# Patient Record
Sex: Male | Born: 1970 | Race: White | Hispanic: Yes | Marital: Married | Smoking: Never smoker
Health system: Southern US, Community
[De-identification: ages and names within clinical notes are randomized; demographics above are authoritative.]

## PROBLEM LIST (undated history)

## (undated) ENCOUNTER — Emergency Department (HOSPITAL_COMMUNITY): Admission: EM | Payer: 59 | Source: Home / Self Care

---

## 1999-04-29 ENCOUNTER — Emergency Department (HOSPITAL_COMMUNITY): Admission: EM | Admit: 1999-04-29 | Discharge: 1999-04-29 | Payer: Self-pay | Admitting: Emergency Medicine

## 1999-06-30 ENCOUNTER — Emergency Department (HOSPITAL_COMMUNITY): Admission: EM | Admit: 1999-06-30 | Discharge: 1999-06-30 | Payer: Self-pay | Admitting: Emergency Medicine

## 1999-10-23 ENCOUNTER — Emergency Department (HOSPITAL_COMMUNITY): Admission: EM | Admit: 1999-10-23 | Discharge: 1999-10-23 | Payer: Self-pay | Admitting: Emergency Medicine

## 1999-10-28 ENCOUNTER — Emergency Department (HOSPITAL_COMMUNITY): Admission: EM | Admit: 1999-10-28 | Discharge: 1999-10-28 | Payer: Self-pay | Admitting: *Deleted

## 2005-07-22 ENCOUNTER — Emergency Department (HOSPITAL_COMMUNITY): Admission: EM | Admit: 2005-07-22 | Discharge: 2005-07-23 | Payer: Self-pay | Admitting: Emergency Medicine

## 2013-07-03 ENCOUNTER — Ambulatory Visit (INDEPENDENT_AMBULATORY_CARE_PROVIDER_SITE_OTHER): Payer: 59 | Admitting: Family Medicine

## 2013-07-03 VITALS — BP 126/72 | HR 50 | Temp 98.0°F | Resp 18 | Ht 65.0 in | Wt 163.0 lb

## 2013-07-03 DIAGNOSIS — R51 Headache: Secondary | ICD-10-CM

## 2013-07-03 DIAGNOSIS — R42 Dizziness and giddiness: Secondary | ICD-10-CM

## 2013-07-03 LAB — POCT CBC
Granulocyte percent: 56.3 % (ref 37–80)
HCT, POC: 45.7 % (ref 43.5–53.7)
Hemoglobin: 15.2 g/dL (ref 14.1–18.1)
Lymph, poc: 2 (ref 0.6–3.4)
MCH, POC: 31 pg (ref 27–31.2)
MCHC: 33.3 g/dL (ref 31.8–35.4)
MCV: 93 fL (ref 80–97)
MID (cbc): 0.4 (ref 0–0.9)
MPV: 9.1 fL (ref 0–99.8)
POC Granulocyte: 3.1 (ref 2–6.9)
POC LYMPH PERCENT: 36.5 %L (ref 10–50)
POC MID %: 7.2 % (ref 0–12)
Platelet Count, POC: 232 10*3/uL (ref 142–424)
RBC: 4.91 M/uL (ref 4.69–6.13)
RDW, POC: 14.3 %
WBC: 5.5 10*3/uL (ref 4.6–10.2)

## 2013-07-03 NOTE — Patient Instructions (Signed)
Dolor de Pensions consultant, preguntas frecuentes y sus respuestas (Headaches, Frequently Asked Questions) CEFALEAS MIGRAOSAS P: Qu es la migraa? Qu la ocasiona? Cmo puedo tratarla? R: En general, la migraa comienza como un dolor apagado. Luego progresa hacia un dolor, constante, punzante y como un latido. Sentir Copy las sienes. Podr sentir Aeronautical engineer parte anterior o posterior de la cabeza, o en uno o ambos lados. El dolor suele estar acompaado de una combinacin de:  Nuseas.  Vmitos.  Sensibilidad a la luz y los ruidos. Algunas personas (un 15%) experimentan un aura (ver abajo) antes de un ataque. La causa de la migraa se debe a reacciones qumicas del cerebro. El tratamiento para la migraa puede incluir medicamentos de [place]. Tambin puede incluir tcnicas de [place]. Estas incluyen entrenamientos para la relajacin y biorretroalimentacin.  P: Qu es un aura? R: Alrededor del 15% de las personas con migraa tiene un "aura". Es una seal de sntomas neurolgicos que ocurren antes de un dolor de cabeza por migraas. Podr ver lneas onduladas o irregulares, puntos o luces parpadeantes. Podr experimentar visin de tnel o puntos ciegos en uno o ambos ojos. El aura puede incluir alucinaciones visuales o auditivas (algo que se imagina). Puede incluir trastornos en el olfato (como olores extraos), el tacto o el gusto. Entre otros sntomas se incluyen:  Adormecimiento.  Sensacin de hormigueo.  Dificultad para recordar o Tax adviser. Estos episodios neurolgicos pueden durar hasta 60 minutos. Los sntomas desaparecern a medida que el dolor de cabeza comience. P:Qu es un disparador? R: Ciertos factores fsicos o Best boy a "disparar" una migraa. Estos son:  Alimentos.  Cambios hormonales.  Clima.  Estrs. Es importante recordar que los disparadores son diferentes entre si. Para ayudar a prevenir ataques de migraas, necesitar  descubrir cules son los Engineer, civil (consulting). Lleve un diario sobre sus dolores de [place]. Este es un buen modo para descubrir los disparadores. El Visual merchandiser en el momento de hablar con el profesional acerca de su enfermedad. P: El clima afecta en las migraas? R: La luz solar, el calor, la humedad y lo cambios drsticos en la presin Doctor, hospital a, o "disparar" un ataque de migraa en [employer]. Pero estudios han demostrado que el clima no acta como disparador para todas las personas con [place]. P: Cul es la relacin entre la migraa y la hormonas? R: Las hormonas inician y [place] funciones corporales. Las hormonas [employer] balance en el cuerpo dentro de los constantes cambios de [place]. Algunas veces, el nivel de hormonas en el cuerpo se desbalancea. Por ejemplo, durante la menstruacin, el embarazo o la [place]. Pueden ser la causa de un ataque de migraa. De hecho, alrededor de tres cuartos de las mujeres con migraa informan que sus ataques estn relacionados con el ciclo menstrual.  P: Aumenta el riesgo de sufrir un choque cardaco en las personas que padecen migraa? R: La probabilidad de que un ataque de migraa ocasione un ataque cardaco es muy remota. Esto no quiere Google persona que sufre de migraa no pueda tener un ataque cardaco asociado con ella. En las personas menores de 40 aos, el factor ms comn para un ataque es la [place]. Pero durante la vida de una persona, la ocurrencia de un dolor de cabeza por migraa est asociada con una reduccin en el riesgo de morir por un ataque cerebrovascular.  P: Cules son los medicamentos para la migraa? R: La  por migraña está asociada con una reducción en el riesgo de morir por un ataque cerebrovascular.   P: ¿Cuáles son los medicamentos para la migraña?  R: La medicación precisa se utiliza para tratar el dolor de cabeza una vez que ha comenzado. Son ejemplos, medicamentos de venta libre, desinflamatorios sin esteroides, ergotamínicos y triptanos.   P: ¿Qué son los triptanos?  R: Lo triptanos son una nueva clase de  medicamentos abortivos. Son específicos para tratar este problema. Los triptanos son vasoconstrictores. Moderan algunas reacciones químicas del cerebro. Los triptanos trabajan como receptores del cerebro. Ayudan a restaurar el balance de un neurotransmisor denominado serotonina. Se cree que las fluctuaciones en los niveles de serotonina son la causa principal de la migraña.   P: ¿Son efectivos los medicamentos de venta libre para la migraña?  R: Los medicamentos de venta libre pueden ser efectivos para aliviar dolores leves a moderados y los síntomas asociados a la migraña. Pero deberá consultar a un médico antes de comenzar cualquier tratamiento para la migraña.   P: ¿Cuáles son los medicamentos de prevención de la migraña?  R: Se suele denominar tratamiento "profiláctico" a los medicamentos para la prevención de la migraña. Se utilizan para reducir la frecuencia, gravedad y duración de los ataques de migraña. Son ejemplos de medicamentos de prevención: antiepilépticos, antidepresivos, bloqueadores beta, bloqueadores de los canales de calcio y medicamentos antiinflamatorios sin esteroides.  P: ¿ Por qué se utilizan anticonvulsivantes para tratar la migraña?  R: Durante los últimos años, ha habido un creciente interés en las drogas antiepilépticas para la prevención de la migraña. A menudo se los conoce como "anticonvulsivantes". La epilepsia y la migraña suceden por reacciones similares en el cerebro.   P: ¿ Por qué se utilizan antidepresivos para tratar la migraña?  R: Los antidepresivos típicamente se utilizan para tratar a las personas con depresión. Pueden reducir la frecuencia de la migraña a través de la regulación de los niveles químicos, como la serotonina, en el cerebro.   P: ¿ Por qué se utilizan terapias alternativas para tratar la migraña?  R: El término "terapias alternativas" suelen utilizarse para describir los tratamientos que se considera que están por fuera de alcance la medicina occidental  convencional. Son ejemplos de las terapias alternativas: la acupuntura, la acupresión y el yoga. Otra terapia alternativa común es la terapia herbal. Se cree que algunas hierbas ayudan a aliviar los dolores de cabeza. Siempre consulte con el profesional acerca de las terapias alternativas antes de utilizarlas. Algunos productos herbales contienen arsénico y otras toxinas.  DOLORES DE CABEZA POR TENSIÓN  P: ¿Qué es un dolor de cabeza por tensión? ¿Qué lo ocasiona? ¿Cómo puedo tratarlo?  R: Los dolores de cabeza por tensión ocurren al azar. A menudo son el resultado de estrés temporario, ansiedad, fatiga o ira. Los síntomas incluyen dolor en las sienes, una sensación como de tener una banda alrededor de la cabeza (un dolor que "presiona"). Los síntomas pueden incluir una sensación de empuje, de presión y contracción de los músculos de la cabeza y el cuello. El dolor comienza en la frente, sienes o en la parte posterior de la cabeza y el cuello. El tratamiento para los dolores de cabeza por tensión puede incluir medicamentos de venta libre. También puede incluir técnicas de autoayuda con entrenamientos para la relajación y biorretroalimentación.  CEFALEA EN RACIMOS  P: ¿Qué es una cefalea en racimos? ¿Qué la ocasiona? ¿Cómo puedo tratarla?  R: La cefalea en racimos toma su nombre debido a que los ataques vienen   en grupos. El dolor aparece con poco o ningún aviso. Normalmente ocurre de un lado de la cabeza. Muchas veces el dolor viene acompañado de un lagrimeo u ojo rojo y goteo de la nariz del mismo lado que el dolor. Se cree que la causa es una reacción en las sustancias químicas del cerebro. Se describe como el caso más grave e intenso de cualquier tipo de dolor de cabeza. El tratamiento incluye medicamentos bajo receta y oxígeno.  CEFALEA SINUSAL  P: ¿Qué es una cefalea sinusal? ¿Qué la ocasiona? ¿Cómo puedo tratarla?  R: Cuando se inflama una cavidad en los huesos de la cara y el cráneo (sinus) ocasiona un dolor  localizado. Esta enfermedad generalmente es el resultado de una reacción alérgica, un tumor o una infección. Si el dolor de cabeza está ocasionado por un bloqueo del sinus, como una infección, probablemente tendrá fiebre. Una imagen de rayos X confirmará el bloqueo del sinus. El tratamiento indicado por el médico podrá incluir antibióticos para la infección, y también antihistamínicos o descongestivos.   DOLOR DE CABEZA POR EFECTO "REBOTE"  P: ¿Qué es un dolor de cabeza por efecto "rebote"? ¿Qué lo ocasiona? ¿Cómo puedo tratarlo?  R: Si se toman medicamentos para el dolor de cabeza muy a menudo puede llevar a la enfermedad conocida como "dolor de cabeza por rebote". Un patrón de abuso de medicamentos para el dolor de cabeza supone tomarlos más de dos veces por semana o en cantidades excesivas. Esto significa más que lo que indica el envase o el médico. Con los dolores de cabeza por rebote, los medicamentos no sólo dejan de aliviar el dolor sino que además comienzan a ocasionar dolores de cabeza. Los médicos tratan los dolores de cabeza por rebote mediante la disminución del medicamento del que se ha abusado. A veces el medico podrá sustituir gradualmente por un tipo diferente de tratamiento o medicación. Dejar de consumirlo podría ser difícil. El abuso regular de un medicamento aumenta el potencial que se produzcan efectos secundarios graves. Consulte con un médico si utiliza regularmente medicamentos para el dolor de cabeza más de dos días por semana o más de lo que indica el envase.  PREGUNTAS Y RESPUESTAS ADICIONALES  P: ¿Qué es la biorretroalimentación?  R: La biorretroalimentación es un tratamiento de autoayuda. La biorretroalimentación utiliza un equipamiento especial para controlar los movimientos involuntarios del cuerpo y las respuestas físicas. La biorretroalimentación controla:  · Respiración.  · Pulso.  · Latidos cardíacos.  · Temperatura.  · Tensión muscular.  · Actividad cerebrales.  La  biorretroalimentación le ayudará a mejorar y perfeccionar sus ejercicios de relajación. Aprenderá a controlar las respuestas físicas relacionadas con el estrés. Una vez que se dominan las técnicas no necesitará más el equipamiento.  P: ¿Son hereditarios los dolores de cabeza?  R: Según algunas estimaciones, aproximadamente 28 millones de estadounidenses sufren migraña. Cuatro de cada cinco (80%) informan una historia familiar de migraña. Los investigadores no pueden asegurar si se trata de un problema genético o una predisposición familiar. A pesar de esto, un niño tiene 50% de probabilidades de sufrir migraña si uno de sus padres la sufre. El niño tiene un 75% de probabilidades si ambos padres la sufren.   P. ¿Puede un niño tener migraña?  R: En el momento de ingresar a la escuela secundaria, la mayoría de los jóvenes han experimentado algún tipo de cefalea. Algunos abordajes o medicamentos seguros y efectivos pueden evitar las cefaleas o detenerlas luego de que han comenzado.   P. ¿Qué tipo de   para las [address]) proporcionar, a pedido, Agricultural engineer de los mdicos que son miembros de [place]. Document Released: 10/29/2008 Document Revised: 02/08/2012 [facility] Patient Information 2014 [place], [place]. Mareos  (Dizziness)  Los mareos son un problema muy frecuente. Es Neomia Dear sensacin de inestabilidad y aturdimiento. Puede sentir que se va a desvanecer. Puede provocarle un traumatismo si se tropieza o se cae. Las Dealer de cualquier edad pueden sufrir  mareos, pero es ms frecuente [employer] ancianos.  CAUSAS  La causa puede deberse a muchos problemas diferentes:   Problemas en el odo medio.  Estar de pie [employer].  Infecciones.  Reacciones alrgicas.  El envejecimiento.  Respuesta emocional a distintas cosas, como por ejemplo la visin de sangre.  Efectos secundarios de Nature conservation officer.  Fatiga.  Problemas circulatorios o de presin arterial.  Consumo excesivo de alcohol, medicamentos o drogas.  Respirar muy rpidamente (hiperventilacin).  Arritmias o problemas con el ritmo cardaco.  Anemia o bajo recuento de glbulos rojos.  Embarazo.  Vmitos, diarrea, fiebre u otras enfermedades que causan deshidratacin.  Enfermedades como presin alta (hipertensin arterial), diabetes, problemas tiroideos y enfermedad de Occupational hygienist.  Exposicin al calor extremo. DIAGNSTICO  Education officer, museum la causa de los [place], el mdico har un examen fsico, indicar anlisis de laboratorio, diagnsticos por imgenes o un electrocardiograma (ECG).  TRATAMIENTO  El tratamiento de los mareos depende de la causa de los sntomas y Advertising account planner.  INSTRUCCIONES PARA EL CUIDADO DEL ENFERMO EN LA CASA  Beba gran cantidad de lquido para mantener la orina de tono claro o color amarillo plido. Esto es especialmente importante en climas muy clidos. A medida que envejece, tambin es importante en climas fros.  Si usted es vertiginoso por las medicaciones, tmelas como se le dirigi. Con las medicaciones de presin arteriales es especialmente importante levantarse despacio.  Prese lentamente de las sillas y [employer] que se sienta bien.  Al levantarse por la maana, sintese primero en un lado de la cama. Cuando se sienta bien, prese lentamente tomndose de algn objeto firme hasta que sentirse en equilibrio.  Si usted encuentra que todava se pone vertiginoso cuando est de pie en un lugar por mucho tiempo, mueva  frecuentemente sus piernas, apriete y relaje los msculos en sus piernas mientras estando de pie.  Si el vrtigo todava contina siendo un problema o le preocupa, tenga alguien con usted por un da o dos hasta que usted mejore y pueda quedarse slo. Haga que esa persona llame a su dador del cuidado si notan cambios en usted que [employer].  No conduzca vehculos ni utilice maquinarias pesadas si se siente mareado.  No beba alcohol. SOLICITE ATENCIN MDICA INMEDIATO SI:  El vrtigo o mareos se estn poniendo peores.  Usted desarrolla nusea o vomitando.  Usted desarrolla problemas hablando o caminando, problemas con debilidad, o problemas usando sus brazos, manos o piernas.  Usted piensa que no est pensando tan claramente como normalmente lo hace, o usted tiene dificultad formando frases. Podr consultar a un amigo o miembro de la familia para que determine si su pensamiento es normal.  Midwife, dolor abdominal, le falta el aire o transpira.  Usted tiene cambios en su visin.  Observa un sangrado.  Usted desarrolla complicaciones con la medicacin que parecen estar ponindose peores en lugar de mejores. EST SEGURO QUE:   Comprende las instrucciones para el alta mdica.  Controlar su enfermedad.  Solicitar atencin mdica de inmediato segn las indicaciones. Document Released:  11/16/2005 Document Revised: 02/08/2012 [employer] Patient Information 2014 [place], [place].

## 2013-07-03 NOTE — Progress Notes (Signed)
[facility]:  Office Visit  Chief Complaint:  Chief Complaint  Patient presents with  . Headache    pain lt side of head x 2 weeks getting dizzy    HPI: Scott English is a 42 y.o. male who complains of left sided headpain and radiates to the back x 2 weeks and he has associated dizziness. Dizziness can occur at anytime, not related to position or head movement. He denies having nausea, vomiting, CP, SOB or abd pain. He has not been confused.  He denies having any vision problems/photophobia/rashes or neck pain. He does get dizzy, x 2 today, dizziness,\ lasts for  2 seconds. He denies more dizziness when he moves his head, he has dizziness when he is walking. His gait is the same. Denies fevers or chills. Has been running without problems. He has not taken anything for this. He drinks a lot of water. No prior history of HA. Denies weakness, urinary sxs,  diaphoresis with this.   History reviewed. No pertinent past medical history. History reviewed. No pertinent past surgical history. History   Social History  . Marital Status: Married    Spouse Name: N/A    Number of Children: N/A  . Years of Education: N/A   Social History Main Topics  . Smoking status: Never Smoker   . Smokeless tobacco: Never Used  . Alcohol Use: No  . Drug Use: No  . Sexually Active: None   Other Topics Concern  . None   Social History Narrative  . None   History reviewed. No pertinent family history. Not on File Prior to Admission medications   Not on File     ROS: The patient denies fevers, chills, night sweats, unintentional weight loss, chest pain, palpitations, wheezing, dyspnea on exertion, nausea, vomiting, abdominal pain, dysuria, hematuria, melena, numbness, weakness, or tingling.   All other systems have been reviewed and were otherwise negative with the exception of those mentioned in the HPI and as above.    PHYSICAL EXAM: Filed Vitals:   07/03/13 2111  BP:  126/72  Pulse: 50  Temp:   Resp:    Filed Vitals:   07/03/13 1944  Height: 5\' 5"  (1.651 m)  Weight: 163 lb (73.936 kg)   Body mass index is 27.12 kg/(m^2).  General: Alert, no acute distress HEENT:  Normocephalic, atraumatic, oropharynx patent. EOMI, PERRLA, fundoscopic exam nl. Tm nl Cardiovascular:  Regular rate and rhythm, no rubs murmurs or gallops.  No Carotid bruits, radial pulse intact. No pedal edema.  Respiratory: Clear to auscultation bilaterally.  No wheezes, rales, or rhonchi.  No cyanosis, no use of accessory musculature GI: No organomegaly, abdomen is soft and non-tender, positive bowel sounds.  No masses. Skin: No rashes. Neurologic: Facial musculature symmetric. CN 2-12 grossly nl Psychiatric: Patient is appropriate throughout our interaction. Lymphatic: No cervical lymphadenopathy Musculoskeletal: Gait intact.   LABS: Results for orders placed in visit on 07/03/13  POCT CBC      Result Value Range   WBC 5.5  4.6 - 10.2 K/uL   Lymph, poc 2.0  0.6 - 3.4   POC LYMPH PERCENT 36.5  10 - 50 %L   MID (cbc) 0.4  0 - 0.9   POC MID % 7.2  0 - 12 %M   POC Granulocyte 3.1  2 - 6.9   Granulocyte percent 56.3  37 - 80 %G   RBC 4.91  4.69 - 6.13 M/uL   Hemoglobin 15.2  14.1 - 18.1  g/dL   HCT, POC 16.1  09.6 - 53.7 %   MCV 93.0  80 - 97 fL   MCH, POC 31.0  27 - 31.2 pg   MCHC 33.3  31.8 - 35.4 g/dL   RDW, POC 04.5     Platelet Count, POC 232  142 - 424 K/uL   MPV 9.1  0 - 99.8 fL     EKG/XRAY:   Primary read interpreted by Dr. Conley Rolls at [facility]   ASSESSMENT/PLAN: Encounter Diagnoses  Name Primary?  . Headache(784.0) Yes  . Dizziness    Orthostatics nl CMP, ESR  pending Try excedrin otc and see if HA goes away then perhaps dizziness goes away F/u prn    Marda Breidenbach PHUONG, DO 07/03/2013 9:35 PM

## 2013-07-04 LAB — COMPREHENSIVE METABOLIC PANEL WITH GFR
ALT: 33 U/L (ref 0–53)
AST: 24 U/L (ref 0–37)
CO2: 28 meq/L (ref 19–32)
Calcium: 9.3 mg/dL (ref 8.4–10.5)
Chloride: 104 meq/L (ref 96–112)
Potassium: 3.8 meq/L (ref 3.5–5.3)
Sodium: 138 meq/L (ref 135–145)
Total Protein: 8.1 g/dL (ref 6.0–8.3)

## 2013-07-04 LAB — COMPREHENSIVE METABOLIC PANEL
Albumin: 4.5 g/dL (ref 3.5–5.2)
Alkaline Phosphatase: 52 U/L (ref 39–117)
BUN: 15 mg/dL (ref 6–23)
Creat: 1.07 mg/dL (ref 0.50–1.35)
Glucose, Bld: 101 mg/dL — ABNORMAL HIGH (ref 70–99)
Total Bilirubin: 0.5 mg/dL (ref 0.3–1.2)

## 2013-07-04 LAB — SEDIMENTATION RATE: Sed Rate: 1 mm/h (ref 0–16)

## 2013-11-27 ENCOUNTER — Emergency Department (HOSPITAL_COMMUNITY): Payer: 59

## 2013-11-27 ENCOUNTER — Emergency Department (HOSPITAL_COMMUNITY)
Admission: EM | Admit: 2013-11-27 | Discharge: 2013-11-27 | Disposition: A | Discharge status: Home / Self Care | Payer: 59 | Attending: Emergency Medicine | Admitting: Emergency Medicine

## 2013-11-27 ENCOUNTER — Encounter (HOSPITAL_COMMUNITY): Payer: Self-pay | Admitting: Emergency Medicine

## 2013-11-27 DIAGNOSIS — R1013 Epigastric pain: Secondary | ICD-10-CM

## 2013-11-27 DIAGNOSIS — R0789 Other chest pain: Secondary | ICD-10-CM | POA: Insufficient documentation

## 2013-11-27 DIAGNOSIS — R1012 Left upper quadrant pain: Secondary | ICD-10-CM | POA: Insufficient documentation

## 2013-11-27 LAB — CBC WITH DIFFERENTIAL/PLATELET
Eosinophils Relative: 6 % — ABNORMAL HIGH (ref 0–5)
HCT: 43.2 % (ref 39.0–52.0)
Hemoglobin: 15.4 g/dL (ref 13.0–17.0)
Lymphocytes Relative: 33 % (ref 12–46)
Lymphs Abs: 1.5 10*3/uL (ref 0.7–4.0)
MCV: 87.1 fL (ref 78.0–100.0)
Platelets: 191 10*3/uL (ref 150–400)
RBC: 4.96 MIL/uL (ref 4.22–5.81)
WBC: 4.7 10*3/uL (ref 4.0–10.5)

## 2013-11-27 LAB — URINALYSIS, ROUTINE W REFLEX MICROSCOPIC
Glucose, UA: NEGATIVE mg/dL
Hgb urine dipstick: NEGATIVE
Nitrite: NEGATIVE
Protein, ur: NEGATIVE mg/dL
Specific Gravity, Urine: 1.013 (ref 1.005–1.030)
Urobilinogen, UA: 0.2 mg/dL (ref 0.0–1.0)

## 2013-11-27 LAB — LIPASE, BLOOD: Lipase: 52 U/L (ref 11–59)

## 2013-11-27 LAB — COMPREHENSIVE METABOLIC PANEL
BUN: 17 mg/dL (ref 6–23)
CO2: 30 mEq/L (ref 19–32)
Calcium: 9 mg/dL (ref 8.4–10.5)
Creatinine, Ser: 0.83 mg/dL (ref 0.50–1.35)
GFR calc Af Amer: >90 mL/min (ref >90)
GFR calc non Af Amer: >90 mL/min (ref >90)
Glucose, Bld: 99 mg/dL (ref 70–99)
Sodium: 139 mEq/L (ref 135–145)
Total Protein: 8.2 g/dL (ref 6.0–8.3)

## 2013-11-27 MED ORDER — GI COCKTAIL ~~LOC~~
30.0000 mL | Freq: Once | ORAL | Status: AC
  Administered 2013-11-27: 30 mL via ORAL
  Filled 2013-11-27: qty 30

## 2013-11-27 MED ORDER — OMEPRAZOLE 20 MG PO CPDR: DELAYED_RELEASE_CAPSULE | ORAL | Status: DC

## 2013-11-27 MED ORDER — SUCRALFATE 1 G PO TABS: 1.0000 g | ORAL_TABLET | Freq: Three times a day (TID) | ORAL | Status: DC

## 2013-11-27 NOTE — ED Notes (Signed)
Translator phones used to speak to patient. And girlfriend assisting with translation. Pt sts was seen at [facility] 3 weeks ago and given rx for abx and

## 2013-11-27 NOTE — ED Notes (Signed)
Translator stated, he's had a stomach ache for 3 weeks and has went to [facility] and no better

## 2013-11-27 NOTE — ED Provider Notes (Signed)
CSN: [id number]     Arrival date & time 11/27/13  1025 History   First MD Initiated Contact with Patient 11/27/13 1101     Chief Complaint  Patient presents with  . Abdominal Pain   (Consider location/radiation/quality/duration/timing/severity/associated sxs/prior Treatment) HPI Comments: Patient with no significant past medical history, no previous abdominal surgeries -- presents with complaint of left upper quadrant abdominal pain and lower chest pain for the past 6 weeks. Patient states that the pain has been worse over the past 2 days. It hurts worse when he pushes on the area over his lower chest and upper abdomen. It is not changed with food. It is not associated with vomiting but he has had some nausea. No fever, change in stools, change in urine. He denies heavy alcohol or NSAID use. He denies chest pain or shortness of breath. The onset of this condition was insidious. The course is constant. Aggravating factors: palpation. Alleviating factors: none.    Patient was seen at an outside urgent care and had lab tests done. Patient and friend forgot to bring the medications he is on, results from the outside urgent care. They do not remember any of these results. It sounds like he was placed on 3 medications, triple therapy? And told that he had a bacteria, H. Pylori? These medications did not improve the patient's symptoms and is here for a second opinion today. He does not have a primary care physician or other primary care followup.  Patient is a 42 y.o. male presenting with abdominal pain. The history is provided by the patient and a friend. A language interpreter was used.  Abdominal Pain Associated symptoms: chest pain and nausea   Associated symptoms: no cough, no diarrhea, no dysuria, no fever, no shortness of breath, no sore throat and no vomiting     History reviewed. No pertinent past medical history. History reviewed. No pertinent past surgical history. No family history on  file. History  Substance Use Topics  . Smoking status: Never Smoker   . Smokeless tobacco: Never Used  . Alcohol Use: No    Review of Systems  Constitutional: Negative for fever.  HENT: Negative for rhinorrhea and sore throat.   Eyes: Negative for redness.  Respiratory: Negative for cough and shortness of breath.   Cardiovascular: Positive for chest pain. Negative for leg swelling.  Gastrointestinal: Positive for nausea and abdominal pain. Negative for vomiting and diarrhea.  Genitourinary: Negative for dysuria.  Musculoskeletal: Negative for myalgias.  Skin: Negative for rash.  Neurological: Negative for headaches.    Allergies  Review of patient's allergies indicates not on file.  Home Medications  No current outpatient prescriptions on file. BP 125/65  Pulse 52  Temp(Src) 98.4 F (36.9 C) (Oral)  Resp 15  SpO2 99% Physical Exam  Nursing note and vitals reviewed. Constitutional: He appears well-developed and well-nourished.  HENT:  Head: Normocephalic and atraumatic.  Eyes: Conjunctivae are normal. Right eye exhibits no discharge. Left eye exhibits no discharge.  Neck: Normal range of motion. Neck supple.  Cardiovascular: Normal rate, regular rhythm and normal heart sounds.   Pulmonary/Chest: Effort normal and breath sounds normal. He exhibits tenderness.    Abdominal: Soft. Bowel sounds are normal. He exhibits no distension. There is tenderness (mild) in the left upper quadrant. There is no rigidity, no rebound, no guarding, no CVA tenderness, no tenderness at McBurney's point and negative Murphy's sign.  Neurological: He is alert.  Skin: Skin is warm and dry.  Psychiatric: He has  a normal mood and affect.    ED Course  Procedures (including critical care time) Labs Review Labs Reviewed - No data to display Imaging Review No results found.  EKG Interpretation   None      11:22 AM Patient seen and examined. Work-up initiated. Patient does not want  medication at this time.    Vital signs reviewed and are as follows: Filed Vitals:   11/27/13 1041  BP: 125/65  Pulse: 52  Temp: 98.4 F (36.9 C)  Resp: 15   1:01 PM patient and family informed of all results. Will discharge to home with PPI and Carafate. He is also given a GI referral and encouraged to followup. PCP referrals also given.  Patient appears well and is eating and drinking in room. Exam is unchanged.  The patient was urged to return to the Emergency Department immediately with worsening of current symptoms, worsening abdominal pain, persistent vomiting, blood noted in stools, fever, or any other concerns. The patient verbalized understanding.      MDM   1. Epigastric pain    Patient with epigastric pain.  Vitals are stable, no fever.  No signs of dehydration, tolerating PO's.  Lungs are clear.  No focal abdominal pain, no concern for appendicitis, cholecystitis, pancreatitis, ruptured viscus, UTI, kidney stone, or any other abdominal etiology.  Suspect gastritis versus PUD. Will refer to GI for definitive diagnosis. Will continue symptomatic treatment. Patient appears well, nontoxic. No surgical emergency suspected. Supportive therapy indicated with return if symptoms worsen.  Patient counseled.     Renne Crigler, PA-C 11/27/13 1302

## 2013-11-29 NOTE — ED Provider Notes (Signed)
Medical screening examination/treatment/procedure(s) were performed by non-physician practitioner and as supervising physician I was immediately available for consultation/collaboration.  EKG Interpretation   None         Rohin Krejci, MD 11/29/13 0657 

## 2014-04-09 ENCOUNTER — Ambulatory Visit (INDEPENDENT_AMBULATORY_CARE_PROVIDER_SITE_OTHER): Payer: 59 | Admitting: Family Medicine

## 2014-04-09 VITALS — BP 124/70 | HR 68 | Temp 97.6°F | Resp 16 | Ht 65.0 in | Wt 163.0 lb

## 2014-04-09 DIAGNOSIS — L255 Unspecified contact dermatitis due to plants, except food: Secondary | ICD-10-CM

## 2014-04-09 DIAGNOSIS — L237 Allergic contact dermatitis due to plants, except food: Secondary | ICD-10-CM

## 2014-04-09 MED ORDER — TRIAMCINOLONE ACETONIDE 0.1 % EX CREA: 1.0000 "application " | TOPICAL_CREAM | Freq: Three times a day (TID) | CUTANEOUS | Status: DC

## 2014-04-09 MED ORDER — HYDROXYZINE HCL 25 MG PO TABS: 12.5000 mg | ORAL_TABLET | Freq: Three times a day (TID) | ORAL | Status: DC | PRN

## 2014-04-09 MED ORDER — METHYLPREDNISOLONE ACETATE 80 MG/ML IJ SUSP
120.0000 mg | Freq: Once | INTRAMUSCULAR | Status: AC
  Administered 2014-04-09: 120 mg via INTRAMUSCULAR

## 2014-04-09 NOTE — Progress Notes (Signed)
Subjective:    Patient ID: Scott English, male    DOB: 02/11/1971, 43 y.o.   MRN: [id number]  Chief Complaint  Patient presents with  . Rash    poison ivy x 3 days    HPI This chart was scribed for Norberto SorensonEva Tomekia Helton, MD by Evon Slackerrance Branch, ED Scribe. This Patient was seen in room 02 and the patients care was started at 8:35 PM  HPI Comments: Scott English is a 43 y.o. male who presents to the [facility] with his girlfriend complaining of poison ivy spread over both arms, around neck, and around his legs onset 3 days prior to arrival. He was working out in the yard and new he was in poison ivy but just kept on going - ripping the weeds out.  He states that he has been trying bleach with no relief of symptoms.   No past medical history on file. Current Outpatient Prescriptions on File Prior to Visit  Medication Sig Dispense Refill  . omeprazole (PRILOSEC) 20 MG capsule Take one capsule PO twice a day for 3 days, then one capsule PO once a day  20 capsule  0  . sucralfate (CARAFATE) 1 G tablet Take 1 tablet (1 g total) by mouth 4 (four) times daily -  with meals and at bedtime.  60 tablet  0   No current facility-administered medications on file prior to visit.   No Known Allergies   Review of Systems  Constitutional: Negative for fever, chills, activity change and appetite change.  Cardiovascular: Negative for leg swelling.  Gastrointestinal: Negative for nausea, vomiting, abdominal pain, diarrhea and constipation.  Musculoskeletal: Negative for gait problem and joint swelling.  Skin: Positive for rash. Negative for wound.  Neurological: Negative for weakness and numbness.  Hematological: Negative for adenopathy. Does not bruise/bleed easily.    Objective:  BP 124/70  Pulse 68  Temp(Src) 97.6 F (36.4 C) (Oral)  Resp 16  Ht 5\' 5"  (1.651 m)  Wt 163 lb (73.936 kg)  BMI 27.12 kg/m2  SpO2 98%  Physical Exam  Nursing note and vitals reviewed. Constitutional:  He is oriented to person, place, and time. He appears well-developed and well-nourished. No distress.  HENT:  Head: Normocephalic and atraumatic.  Eyes: EOM are normal.  Neck: Neck supple.  Cardiovascular: Normal rate.   Pulmonary/Chest: Effort normal. No respiratory distress.  Musculoskeletal: Normal range of motion.  Neurological: He is alert and oriented to person, place, and time.  Skin: Skin is warm and dry.  Multiple pinpoint vesicles on erythematous space spread diffusely over all 4 extremities.   Psychiatric: He has a normal mood and affect. His behavior is normal.    Assessment & Plan:  Poison ivy - Plan: methylPREDNISolone acetate (DEPO-MEDROL) injection 120 mg Will proceed w/ systemic trx due to diffuse areas affected. Pt prefers injection. Warned of prolonged course w/ rus dermatitis so can start stop trxing pruritic or new areas w/ top TAC in 2-3d. Warned of poss of secondary bacterial infxn - RTC if persists or worsens. Meds ordered this encounter  Medications  . methylPREDNISolone acetate (DEPO-MEDROL) injection 120 mg    Sig:   . hydrOXYzine (ATARAX/VISTARIL) 25 MG tablet    Sig: Take 0.5-1 tablets (12.5-25 mg total) by mouth every 8 (eight) hours as needed for itching.    Dispense:  30 tablet    Refill:  0  . triamcinolone cream (KENALOG) 0.1 %    Sig: Apply 1 application topically 3 (three)  times daily.    Dispense:  85.2 g    Refill:  0    I personally performed the services described in this documentation, which was scribed in my presence. The recorded information has been reviewed and considered, and addended by me as needed.  Norberto SorensonEva Haislee Corso, MD MPH

## 2014-04-09 NOTE — Patient Instructions (Signed)
Poison Ivy Poison ivy is a inflammation of the skin (contact dermatitis) caused by touching the allergens on the leaves of the ivy plant following previous exposure to the plant. The rash usually appears 48 hours after exposure. The rash is usually bumps (papules) or blisters (vesicles) in a linear pattern. Depending on your own sensitivity, the rash may simply cause redness and itching, or it may also progress to blisters which may break open. These must be well cared for to prevent secondary bacterial (germ) infection, followed by scarring. Keep any open areas dry, clean, dressed, and covered with an antibacterial ointment if needed. The eyes may also get puffy. The puffiness is worst in the morning and gets better as the day progresses. This dermatitis usually heals without scarring, within 2 to 3 weeks without treatment. HOME CARE INSTRUCTIONS  Thoroughly wash with soap and water as soon as you have been exposed to poison ivy. You have about one half hour to remove the plant resin before it will cause the rash. This washing will destroy the oil or antigen on the skin that is causing, or will cause, the rash. Be sure to wash under your fingernails as any plant resin there will continue to spread the rash. Do not rub skin vigorously when washing affected area. Poison ivy cannot spread if no oil from the plant remains on your body. A rash that has progressed to weeping sores will not spread the rash unless you have not washed thoroughly. It is also important to wash any clothes you have been wearing as these may carry active allergens. The rash will return if you wear the unwashed clothing, even several days later. Avoidance of the plant in the future is the best measure. Poison ivy plant can be recognized by the number of leaves. Generally, poison ivy has three leaves with flowering branches on a single stem. Diphenhydramine may be purchased over the counter and used as needed for itching. Do not drive with  this medication if it makes you drowsy.Ask your caregiver about medication for children. SEEK MEDICAL CARE IF:  Open sores develop.  Redness spreads beyond area of rash.  You notice purulent (pus-like) discharge.  You have increased pain.  Other signs of infection develop (such as fever). Document Released: 11/13/2000 Document Revised: 02/08/2012 Document Reviewed: 10/02/2009 ExitCare Patient Information 2014 [employer].  

## 2014-04-13 ENCOUNTER — Ambulatory Visit (INDEPENDENT_AMBULATORY_CARE_PROVIDER_SITE_OTHER): Payer: 59 | Admitting: Physician Assistant

## 2014-04-13 VITALS — BP 110/60 | HR 70 | Temp 98.2°F | Resp 16 | Ht 65.0 in | Wt 157.0 lb

## 2014-04-13 DIAGNOSIS — L255 Unspecified contact dermatitis due to plants, except food: Secondary | ICD-10-CM

## 2014-04-13 DIAGNOSIS — L282 Other prurigo: Secondary | ICD-10-CM

## 2014-04-13 DIAGNOSIS — L237 Allergic contact dermatitis due to plants, except food: Secondary | ICD-10-CM

## 2014-04-13 MED ORDER — METHYLPREDNISOLONE SODIUM SUCC 125 MG IJ SOLR
125.0000 mg | Freq: Once | INTRAMUSCULAR | Status: AC
  Administered 2014-04-13: 125 mg via INTRAMUSCULAR

## 2014-04-13 MED ORDER — PREDNISONE 20 MG PO TABS: ORAL_TABLET | ORAL | Status: DC

## 2014-04-13 NOTE — Patient Instructions (Signed)
Take Zyrtec daily in the morning and Atarax at bedtime Start prednisone taper tomorrow (take doses in the a.m.) Continue TAC cream 2-3 times daily         [facility] Poison ivy is a inflammation of the skin (contact dermatitis) caused by touching the allergens on the leaves of the ivy plant following previous exposure to the plant. The rash usually appears 48 hours after exposure. The rash is usually bumps (papules) or blisters (vesicles) in a linear pattern. Depending on your own sensitivity, the rash may simply cause redness and itching, or it may also progress to blisters which may break open. These must be well cared for to prevent secondary bacterial (germ) infection, followed by scarring. Keep any open areas dry, clean, dressed, and covered with an antibacterial ointment if needed. The eyes may also get puffy. The puffiness is worst in the morning and gets better as the day progresses. This dermatitis usually heals without scarring, within 2 to 3 weeks without treatment. HOME CARE INSTRUCTIONS  Thoroughly wash with soap and water as soon as you have been exposed to poison ivy. You have about one half hour to remove the plant resin before it will cause the rash. This washing will destroy the oil or antigen on the skin that is causing, or will cause, the rash. Be sure to wash under your fingernails as any plant resin there will continue to spread the rash. Do not rub skin vigorously when washing affected area. Poison ivy cannot spread if no oil from the plant remains on your body. A rash that has progressed to weeping sores will not spread the rash unless you have not washed thoroughly. It is also important to wash any clothes you have been wearing as these may carry active allergens. The rash will return if you wear the unwashed clothing, even several days later. Avoidance of the plant in the future is the best measure. Poison ivy plant can be recognized by the number of leaves. Generally, poison  ivy has three leaves with flowering branches on a single stem. Diphenhydramine may be purchased over the counter and used as needed for itching. Do not drive with this medication if it makes you drowsy.Ask your caregiver about medication for children. SEEK MEDICAL CARE IF:  Open sores develop.  Redness spreads beyond area of rash.  You notice purulent (pus-like) discharge.  You have increased pain.  Other signs of infection develop (such as fever). Document Released: 11/13/2000 Document Revised: 02/08/2012 Document Reviewed: 10/02/2009 [facility] Patient Information 2014 [place], [place].

## 2014-04-14 NOTE — Progress Notes (Signed)
   Subjective:    Patient ID: Scott SledgeMargarito English, male    DOB: 1971/06/01, 43 y.o.   MRN: [id number]  HPI 43 year old hispanic male presents for recheck of poison ivy.  He was seen here on 5/11 and dx with poison ivy - treated with Depomedrol injection, TAC cream, and Atarax to take at bedtime. States this helped somewhat but over the past 2 days his symptoms have significantly worsened. He continues to have new patches of poison ivy erupt and it is now on his face and torso in addition to both arms and legs.  Continues to be intensely pruritic and does not seem to be improving at all.   No lip/tongue swelling. No groin involvement.  Patient is otherwise doing well with no other concerns today.     Review of Systems  Constitutional: Negative for fever and chills.  HENT: Negative for trouble swallowing.   Skin: Positive for rash.       Objective:   Physical Exam  Constitutional: He is oriented to person, place, and time. He appears well-developed and well-nourished.  HENT:  Head: Normocephalic and atraumatic.  Right Ear: External ear normal.  Left Ear: External ear normal.  Eyes: Conjunctivae are normal.  Neck: Normal range of motion.  Cardiovascular: Normal rate.   Pulmonary/Chest: Effort normal.  Neurological: He is alert and oriented to person, place, and time.  Skin: Rash noted. Rash is maculopapular.  Diffuse rash over both arms and legs. Several isolated patches on face and torso. Linear, erythematous papules.   Psychiatric: He has a normal mood and affect. His behavior is normal. Judgment and thought content normal.          Assessment & Plan:  Poison ivy - Plan: methylPREDNISolone sodium succinate (SOLU-MEDROL) 125 mg/2 mL injection 125 mg, predniSONE (DELTASONE) 20 MG tablet  Pruritic rash - Plan: methylPREDNISolone sodium succinate (SOLU-MEDROL) 125 mg/2 mL injection 125 mg, predniSONE (DELTASONE) 20 MG tablet  Will treat aggressively with Solumedrol 125 mg IM today.  Start prednisone taper tomorrow Continue Atarax and TAC cream as directed. Add Zyrtec daily in the morning Follow up if symptoms worsen or fail to improve.

## 2014-05-16 ENCOUNTER — Ambulatory Visit (INDEPENDENT_AMBULATORY_CARE_PROVIDER_SITE_OTHER): Payer: 59 | Admitting: Emergency Medicine

## 2014-05-16 VITALS — BP 100/70 | HR 61 | Temp 97.8°F | Resp 14 | Ht 65.0 in | Wt 164.0 lb

## 2014-05-16 DIAGNOSIS — L259 Unspecified contact dermatitis, unspecified cause: Secondary | ICD-10-CM

## 2014-05-16 DIAGNOSIS — L309 Dermatitis, unspecified: Secondary | ICD-10-CM

## 2014-05-16 MED ORDER — HYDROXYZINE HCL 25 MG PO TABS: 25.0000 mg | ORAL_TABLET | Freq: Three times a day (TID) | ORAL | Status: DC | PRN

## 2014-05-16 MED ORDER — HYDROXYZINE HCL 25 MG PO TABS: 2.0000 mg | ORAL_TABLET | Freq: Three times a day (TID) | ORAL | Status: DC | PRN

## 2014-05-16 MED ORDER — TRIAMCINOLONE ACETONIDE 0.1 % EX CREA: 1.0000 "application " | TOPICAL_CREAM | Freq: Two times a day (BID) | CUTANEOUS | Status: DC

## 2014-05-16 NOTE — Progress Notes (Signed)
[facility] [address], [place] [id number] [phone]  Date:  05/16/2014   Name:  Scott SledgeMargarito English   DOB:  09-11-1971   MRN:  [id number]  PCP:  No PCP Per Patient    Chief Complaint: Itching   History of Present Illness:  Scott SledgeMargarito English is a 43 y.o. very pleasant male patient who presents with the following:  Treated twice last month for poison ivy.  Says it resolved. Now has a papular rash that is excoriated on extremities and trunk.  Intensely pruritic. No improvement with over the counter medications or other home remedies. Denies other complaint or health concern today.   There are no active problems to display for this patient.   No past medical history on file.  No past surgical history on file.  History  Substance Use Topics  . Smoking status: Never Smoker   . Smokeless tobacco: Never Used  . Alcohol Use: 0.0 oz/week    0 Cans of beer per week     Comment: occasional    No family history on file.  No Known Allergies  Medication list has been reviewed and updated.  Current Outpatient Prescriptions on File Prior to Visit  Medication Sig Dispense Refill  . omeprazole (PRILOSEC) 20 MG capsule Take one capsule PO twice a day for 3 days, then one capsule PO once a day  20 capsule  0  . hydrOXYzine (ATARAX/VISTARIL) 25 MG tablet Take 0.5-1 tablets (12.5-25 mg total) by mouth every 8 (eight) hours as needed for itching.  30 tablet  0  . predniSONE (DELTASONE) 20 MG tablet Take 3 PO QAM x3days, 2 PO QAM x3days, 1 PO QAM x3days  18 tablet  0  . sucralfate (CARAFATE) 1 G tablet Take 1 tablet (1 g total) by mouth 4 (four) times daily -  with meals and at bedtime.  60 tablet  0  . triamcinolone cream (KENALOG) 0.1 % Apply 1 application topically 3 (three) times daily.  85.2 g  0   No current facility-administered medications on file prior to visit.    Review of Systems:  As per HPI, otherwise negative.    Physical Examination: Filed  Vitals:   05/16/14 2010  BP: 100/70  Pulse: 61  Temp: 97.8 F (36.6 C)  Resp: 14   Filed Vitals:   05/16/14 2010  Height: 5\' 5"  (1.651 m)  Weight: 164 lb (74.39 kg)   Body mass index is 27.29 kg/(m^2). Ideal Body Weight: Weight in (lb) to have BMI = 25: 149.9   GEN: WDWN, NAD, Non-toxic, Alert & Oriented x 3 HEENT: Atraumatic, Normocephalic.  Ears and Nose: No external deformity. EXTR: No clubbing/cyanosis/edema NEURO: Normal gait.  PSYCH: Normally interactive. Conversant. Not depressed or anxious appearing.  Calm demeanor.  SKIN:   Eczema   Assessment and Plan: Eczema  Signed,  Phillips OdorJeffery Anderson, MD

## 2014-05-16 NOTE — Patient Instructions (Signed)
Eczema (Eczema) El eczema, tambin llamada dermatitis atpica, es una afeccin de la piel que causa inflamacin de la misma. Este trastorno produce una erupcin roja y sequedad y escamas en la piel. Hay gran picazn. El eczema generalmente empeora durante los meses fros del invierno y generalmente desaparece o mejora con el tiempo clido del verano. El eczema generalmente comienza a manifestarse en la infancia. Algunos nios desarrollan este trastorno y ste puede prolongarse en la Facilities manager.  CAUSAS  La causa exacta no se conoce pero parece ser una afeccin hereditaria. Generalmente las personas que sufren eczema tienen una historia familiar de eczema, alergias, asma o fiebre de heno. Esta enfermedad no es contagiosa. Algunas causas de los brotes pueden ser:   Contacto con alguna cosa a la que es sensible o Air cabin crew.  Psychologist, forensic. SIGNOS Y SNTOMAS  Piel seca y escamosa.  Erupcin roja y que pica.  Picazn. Esta puede ocurrir antes de que aparezca la erupcin y puede ser muy intensa. DIAGNSTICO  El diagnstico de eczema se realiza basndose en los sntomas y en la historia clnica. TRATAMIENTO  El eczema no puede curarse, pero los sntomas generalmente pueden controlarse con tratamiento y Teacher, music. Un plan de tratamiento puede incluir:  Control de la picazn y el rascado.  Utilice antihistamnicos de venta libre segn las indicaciones, para Barrister's clerk. Es especialmente til por las noches cuando la picazn tiende a Copy.  Utilice medicamentos de venta libre para la picazn, segn las indicaciones del mdico.  Evite rascarse. El rascado hace que la picazn empeore. Tambin puede producir una infeccin en la piel (imptigo) debido a las lesiones en la piel causadas por el rascado.  Mantenga la piel bien humectada con cremas, todos [place]. La piel quedar hmeda y ayudar a prevenir la sequedad. Las lociones que contengan alcohol y agua deben evitarse debido a que pueden  Advice worker.  Limite la exposicin a las cosas a las que es sensible o alrgico (alrgenos).  Reconozca las situaciones que puedan causar estrs.  Desarrolle un plan para controlar el estrs. [place] slo medicamentos de venta libre o recetados, segn las indicaciones del mdico.  No aplique nada sobre la piel sin Teacher, adult education a su mdico.  Deber tomar baos o duchas de corta duracin (5 minutos) en agua tibia (no caliente). Use jabones suaves para el bao. No deben tener perfume. Puede agregar aceite de bao no perfumado al agua del bao. Es Dispensing optician el jabn y el bao de espuma.  Inmediatamente despus del bao o de la ducha, cuando la piel aun est hmeda, aplique una crema humectante en todo el cuerpo. Este ungento debe ser en base a vaselina. La piel quedar hmeda y ayudar a prevenir la sequedad. Cuanto ms espeso sea el ungento, mejor. No deben tener perfume.  [place] uas cortas. Es posible que los nios con eczema necesiten usar guantes o mitones por la noche, despus de aplicarse el ungento.  Vista al [employer] con ropa de algodn o International aid/development worker de algodn. Vstalo con ropas ligeras ya que el calor aumenta la picazn.  Un nio con eczema debe permanecer alejado de personas que tengan ampollas febriles o llagas del resfro. El virus que causa las ampollas febriles (herpes simple) puede ocasionar una infeccin grave en la piel de los nios que padecen eczema. SOLICITE ATENCIN MDICA SI:   La picazn le impide dormir.  La erupcin empeora o no mejora dentro de Best boy en  la que se Nurse, mental healthinicia el tratamiento.  Observa pus o costras amarillas en la zona de la erupcin.  Tiene fiebre.  Aparece un brote despus de haber estado en contacto con alguna persona que tiene ampollas febriles. Document Released: 11/16/2005 Document Revised: 09/06/2013 [facility] Patient Information 2015 [place]ExitCare, [place]LLC. This information is not intended to replace  advice given to you by your health care provider. Make sure you discuss any questions you have with your health care provider.

## 2015-08-26 ENCOUNTER — Ambulatory Visit (INDEPENDENT_AMBULATORY_CARE_PROVIDER_SITE_OTHER): Payer: 59 | Admitting: Emergency Medicine

## 2015-08-26 DIAGNOSIS — K2990 Gastroduodenitis, unspecified, without bleeding: Secondary | ICD-10-CM | POA: Diagnosis not present

## 2015-08-26 DIAGNOSIS — K2970 Gastritis, unspecified, without bleeding: Secondary | ICD-10-CM

## 2015-08-26 MED ORDER — LANSOPRAZOLE 30 MG PO CPDR: 30.0000 mg | DELAYED_RELEASE_CAPSULE | Freq: Every day | ORAL | Status: DC

## 2015-08-26 NOTE — Patient Instructions (Signed)
Gastritis - Adultos  °(Gastritis, Adult) ° La gastrittis es la irritación (inflamación) de la membrana interna del estómago. Puede ser una enfermedad de inicio súbito (aguda) o de largo plazo (crónica). Si la gastritis no se trata, puede causar sangrado y úlceras. °CAUSAS  °La gastritis se produce cuando la membrana que tapiza interiormente al estómago se debilita o se daña. Los jugos digestivos del estómago inflaman el revestimiento del estómago debilitado. El revestimiento del estómago puede debilitarse o dañarse por una infección viral o bacteriana. La infección bacteriana más común es la infección por Helicobacter pylori. También puede ser el resultado del consumo excesivo de alcohol, por el uso de ciertos medicamentos o porque hay demasiado ácido en el estómago.  °SÍNTOMAS  °En algunos casos no hay síntomas. Si se presentan síntomas, éstos pueden ser:  °· Dolor o sensación de ardor en la parte superior del abdomen. °· Náuseas. °· Vómitos. °· Sensación molesta de distensión después de comer. °DIAGNÓSTICO  °El médico puede diagnosticar gastritis según los síntomas y el examen físico. Para determinar la causa de la gastritis, el médico podrá:  °· Pedir análisis de sangre o de materia fecal para diagnosticar la presencia de la bacteria H pylori. °· Gastroscopía. Un tubo delgado y flexible (endoscopio) se pasa por el esófago hasta llegar al estómago. El endoscopio tiene una luz y una cámara en el extremo. El médico utilizará el endoscopio para observar el interior del estómago. °· Tomará una muestra de tejido (biopsia) del estómago para examinarlo en el microscopio. °TRATAMIENTO  °Según la causa de la gastritis podrán recetarle: Antibióticos, si la causa es una infección bacteriana, como una infección por H. pylori. Antiácidos o bloqueadores H2, si hay demasiado ácido en el estómago. El médico le aconsejará que deje de tomar aspirina, ibuprofeno u otros antiinflamatorios no esteroides (AINE).  °INSTRUCCIONES PARA EL  CUIDADO EN EL HOGAR  °· Tome sólo medicamentos de venta libre o recetados, según las indicaciones del médico. °· Si le han recetado antibióticos, tómelos según las indicaciones. Tómelos todos, aunque se sienta mejor. °· Debe ingerir gran cantidad de líquido para mantener la orina de tono claro o color amarillo pálido. °· Evite las comidas y bebidas que empeoran los problemas, como: °¨ Bebidas con cafeína o alcohólicas. °¨ Chocolate. °¨ Sabores a menta. °¨ Ajo y cebolla. °¨ Comidas muy condimentadas. °¨ Cítricos como naranjas, limones o limas. °¨ Alimentos que contengan tomate, como salsas, chile y pizza. °¨ Alimentos fritos y grasos. °· Haga comidas pequeñas durante el día en lugar de 3 comidas abundantes. °SOLICITE ATENCIÓN MÉDICA DE INMEDIATO SI:  °· La materia fecal es negra o de color rojo oscuro. °· Vomita sangre de color rojo brillante o material similar a granos de café. °· No puede retener los líquidos. °· El dolor abdominal empeora. °· Tiene fiebre. °· No mejora luego de 1 semana. °· Tiene preguntas o preocupaciones. °ASEGÚRESE DE QUE:  °· Comprende estas instrucciones. °· Controlará su enfermedad. °· Solicitará ayuda de inmediato si no mejora o si empeora. °Document Released: 08/26/2005 Document Revised: 08/10/2012 °ExitCare® Patient Information ©2015 Exit[employer], LLC. This information is not intended to replace advice given to you by your health care provider. Make sure you discuss any questions you have with your health care provider. ° °

## 2015-08-26 NOTE — Progress Notes (Signed)
Subjective:  Patient ID: Scott English, male    DOB: 10/18/1971  Age: 44 y.o. MRN: [id number]  CC: Fatigue and Chest Pain   HPI Scott English presents  with upper abdominal epigastric burning. He denies any nausea or vomiting. Denies any fever or chills. Denies any stool change. Denies any symptoms suggestive of reflux esophagitis. He has intolerance to coffee and tea. Pain is not an abuser of aspirin or non-steroidal and inflammatory medication. He has no blood in stool black stools or vomiting blood.  History Scott English has no past medical history on file.   He has no past surgical history on file.   His  family history is not on file.  He   reports that he has never smoked. He has never used smokeless tobacco. He reports that he drinks alcohol. He reports that he does not use illicit drugs.  Outpatient Prescriptions Prior to Visit  Medication Sig Dispense Refill  . omeprazole (PRILOSEC) 20 MG capsule Take one capsule PO twice a day for 3 days, then one capsule PO once a day (Patient not taking: Reported on 08/26/2015) 20 capsule 0  . hydrOXYzine (ATARAX/VISTARIL) 25 MG tablet Take 1-2 tablets (25-50 mg total) by mouth every 8 (eight) hours as needed for itching. 40 tablet 0  . predniSONE (DELTASONE) 20 MG tablet Take 3 PO QAM x3days, 2 PO QAM x3days, 1 PO QAM x3days 18 tablet 0  . sucralfate (CARAFATE) 1 G tablet Take 1 tablet (1 g total) by mouth 4 (four) times daily -  with meals and at bedtime. 60 tablet 0  . triamcinolone cream (KENALOG) 0.1 % Apply 1 application topically 3 (three) times daily. 85.2 g 0  . triamcinolone cream (KENALOG) 0.1 % Apply 1 application topically 2 (two) times daily. 454 g 0   No facility-administered medications prior to visit.    Social History   Social History  . Marital Status: Married    Spouse Name: N/A  . Number of Children: N/A  . Years of Education: N/A   Social History Main Topics  . Smoking status: Never Smoker   . Smokeless  tobacco: Never Used  . Alcohol Use: 0.0 oz/week    0 Cans of beer per week     Comment: occasional  . Drug Use: No  . Sexual Activity: Not on file   Other Topics Concern  . Not on file   Social History Narrative  . No narrative on file     Review of Systems  Constitutional: Negative for fever, chills and appetite change.  HENT: Negative for congestion, ear pain, postnasal drip, sinus pressure and sore throat.   Eyes: Negative for pain and redness.  Respiratory: Negative for cough, shortness of breath and wheezing.   Cardiovascular: Negative for leg swelling.  Gastrointestinal: Positive for nausea and abdominal pain. Negative for vomiting, diarrhea, constipation and blood in stool.  Endocrine: Negative for polyuria.  Genitourinary: Negative for dysuria, urgency, frequency and flank pain.  Musculoskeletal: Negative for gait problem.  Skin: Negative for rash.  Neurological: Negative for weakness and headaches.  Psychiatric/Behavioral: Negative for confusion and decreased concentration. The patient is not nervous/anxious.     Objective:  There were no vitals taken for this visit.  Physical Exam  Constitutional: He is oriented to person, place, and time. He appears well-developed and well-nourished. No distress.  HENT:  Head: Normocephalic and atraumatic.  Right Ear: External ear normal.  Left Ear: External ear normal.  Nose: Nose normal.  Eyes: Conjunctivae  and EOM are normal. Pupils are equal, round, and reactive to light. No scleral icterus.  Neck: Normal range of motion. Neck supple. No tracheal deviation present.  Cardiovascular: Normal rate, regular rhythm and normal heart sounds.   Pulmonary/Chest: Effort normal. No respiratory distress. He has no wheezes. He has no rales.  Abdominal: He exhibits no mass. There is no tenderness. There is no rebound and no guarding.  Musculoskeletal: He exhibits no edema.  Lymphadenopathy:    He has no cervical adenopathy.    Neurological: He is alert and oriented to person, place, and time. Coordination normal.  Skin: Skin is warm and dry. No rash noted.  Psychiatric: He has a normal mood and affect. His behavior is normal.      Assessment & Plan:   Scott English was seen today for fatigue and chest pain.  Diagnoses and all orders for this visit:  Gastritis and gastroduodenitis  Other orders -     lansoprazole (PREVACID) 30 MG capsule; Take 1 capsule (30 mg total) by mouth daily at 12 noon.  I have discontinued Scott English sucralfate, triamcinolone cream, predniSONE, triamcinolone cream, and hydrOXYzine. I am also having him start on lansoprazole. Additionally, I am having him maintain his omeprazole.  Meds ordered this encounter  Medications  . lansoprazole (PREVACID) 30 MG capsule    Sig: Take 1 capsule (30 mg total) by mouth daily at 12 noon.    Dispense:  30 capsule    Refill:  5    Appropriate red flag conditions were discussed with the patient as well as actions that should be taken.  Patient expressed his understanding.  Follow-up: Return if symptoms worsen or fail to improve.  Carmelina Dane, MD

## 2017-02-20 LAB — GLUCOSE, POCT (MANUAL RESULT ENTRY): POC GLUCOSE: 97 mg/dL (ref 70–99)

## 2018-05-02 ENCOUNTER — Other Ambulatory Visit (INDEPENDENT_AMBULATORY_CARE_PROVIDER_SITE_OTHER): Payer: 59

## 2018-05-02 ENCOUNTER — Ambulatory Visit (INDEPENDENT_AMBULATORY_CARE_PROVIDER_SITE_OTHER)
Admission: RE | Admit: 2018-05-02 | Discharge: 2018-05-02 | Disposition: A | Discharge status: Home / Self Care | Payer: 59 | Source: Ambulatory Visit | Attending: Internal Medicine | Admitting: Internal Medicine

## 2018-05-02 ENCOUNTER — Encounter: Payer: Self-pay | Admitting: Internal Medicine

## 2018-05-02 ENCOUNTER — Ambulatory Visit: Payer: 59 | Admitting: Internal Medicine

## 2018-05-02 VITALS — BP 120/72 | HR 89 | Ht 63.0 in | Wt 165.0 lb

## 2018-05-02 DIAGNOSIS — R05 Cough: Secondary | ICD-10-CM

## 2018-05-02 DIAGNOSIS — R0609 Other forms of dyspnea: Secondary | ICD-10-CM

## 2018-05-02 DIAGNOSIS — R053 Chronic cough: Secondary | ICD-10-CM

## 2018-05-02 DIAGNOSIS — R0600 Dyspnea, unspecified: Secondary | ICD-10-CM

## 2018-05-02 LAB — CBC WITH DIFFERENTIAL/PLATELET
Basophils Absolute: 0 10*3/uL (ref 0.0–0.1)
Basophils Relative: 0.6 % (ref 0.0–3.0)
EOS ABS: 0.1 10*3/uL (ref 0.0–0.7)
EOS PCT: 1.8 % (ref 0.0–5.0)
HCT: 43.5 % (ref 39.0–52.0)
Hemoglobin: 14.9 g/dL (ref 13.0–17.0)
LYMPHS ABS: 1.3 10*3/uL (ref 0.7–4.0)
Lymphocytes Relative: 24 % (ref 12.0–46.0)
MCHC: 34.4 g/dL (ref 30.0–36.0)
MCV: 87.4 fl (ref 78.0–100.0)
MONO ABS: 0.4 10*3/uL (ref 0.1–1.0)
Monocytes Relative: 7.2 % (ref 3.0–12.0)
NEUTROS PCT: 66.4 % (ref 43.0–77.0)
Neutro Abs: 3.6 10*3/uL (ref 1.4–7.7)
Platelets: 239 10*3/uL (ref 150.0–400.0)
RBC: 4.97 Mil/uL (ref 4.22–5.81)
RDW: 14.4 % (ref 11.5–15.5)
WBC: 5.4 10*3/uL (ref 4.0–10.5)

## 2018-05-02 LAB — SEDIMENTATION RATE: Sed Rate: 22 mm/hr — ABNORMAL HIGH (ref 0–15)

## 2018-05-02 MED ORDER — PANTOPRAZOLE SODIUM 40 MG PO TBEC: 40.0000 mg | DELAYED_RELEASE_TABLET | Freq: Every day | ORAL | 2 refills | Status: DC

## 2018-05-02 MED ORDER — FAMOTIDINE 20 MG PO TABS: ORAL_TABLET | ORAL | 11 refills | Status: DC

## 2018-05-02 NOTE — Assessment & Plan Note (Addendum)
05/02/2018  Walked RA x 3 laps @ 185 ft each stopped due to  End of study, fast pace   Not able to reproduce this chronic complaint in office > rec work on allergy rx first

## 2018-05-02 NOTE — Patient Instructions (Addendum)
Pantoprazole (protonix) 40 mg   Take  30-60 min before first meal of the day and Pepcid (famotidine)  20 mg one @  bedtime until return to office - this is the best way to tell whether stomach acid is contributing to your problem.     GERD (REFLUX)  is an extremely common cause of respiratory symptoms just like yours , many times with no obvious heartburn at all.    It can be treated with medication, but also with lifestyle changes including elevation of the head of your bed (ideally with 6 inch  bed blocks),  Smoking cessation, avoidance of late meals, excessive alcohol, and avoid fatty foods, chocolate, peppermint, colas, red wine, and acidic juices such as orange juice.  NO MINT OR MENTHOL PRODUCTS SO NO COUGH DROPS   USE SUGARLESS CANDY INSTEAD (Jolley ranchers or Stover's or Life Savers) or even ice chips will also do - the key is to swallow to prevent all throat clearing. NO OIL BASED VITAMINS - use powdered substitutes.   Please remember to go to the lab and x-ray department downstairs in the basement  for your tests - we will call you with the results when they are available.   Please schedule a follow up office visit in 6 weeks, call sooner if needed and we will set you up to follow up with Dr Jayme CloudGonzalez then

## 2018-05-02 NOTE — Progress Notes (Signed)
Subjective:     Patient ID: Scott English, male   DOB: June 29, 1971,   MRN: [id number]  HPI  47 yo latino male never smoker with chronic cough since arrival in [place] 2005 mostly assoc with throat clearing referred to pulmonary clinic 05/02/2018 by Dr   Via with abn cx     05/02/2018 1st [facility] office visit/ Scott English   Chief Complaint  Patient presents with  . Pulmonary Consult    Referred by Dr. Caryn BeeKevin Via for eval of abnormal cxr. Pt states he was hit by a fork lift at work 2 wks ago and this is why cxr was done.  He states he has had throat clearing problem for years, and since his accident his cough seems worse- prod with clear sputum.     Cough is day > noct/ really min vol x years no better on gerd rx so far but not consistent with rx ac  Doe also x years x more than slow walk    No obvious day to day or daytime variability or assoc excess/ purulent sputum or mucus plugs or hemoptysis or cp or chest tightness, subjective wheeze or overt sinus or hb symptoms. No unusual exposure hx or h/o childhood pna/ asthma or knowledge of premature birth.  Sleeping ok without nocturnal  or early am exacerbation  of respiratory  c/o's or need for noct saba. Also denies any obvious fluctuation of symptoms with weather or environmental changes or other aggravating or alleviating factors except as outlined above   Current Allergies, Complete Past Medical History, Past Surgical History, Family History, and Social History were reviewed in [facility] electronic medical record.  ROS  The following are not active complaints unless bolded Hoarseness, sore throat, dysphagia, dental problems, itching, sneezing,  nasal congestion or discharge of excess mucus or purulent secretions, ear ache,   fever, chills, sweats, unintended wt loss or wt gain, classically pleuritic or exertional cp,  orthopnea pnd or arm/hand swelling  or leg swelling, presyncope, palpitations, abdominal pain, anorexia, nausea,  vomiting, diarrhea  or change in bowel habits or change in bladder habits, change in stools or change in urine, dysuria, hematuria,  rash, arthralgias, visual complaints, headache, numbness, weakness or ataxia or problems with walking or coordination,  change in mood or  memory.         meds; otc prilosec prn   Review of Systems     Objective:   Physical Exam    amb latino male   Wt Readings from Last 3 Encounters:  05/02/18 165 lb (74.8 kg)  05/16/14 164 lb (74.4 kg)  04/13/14 157 lb (71.2 kg)     Vital signs reviewed - Note on arrival 02 sats  99% on RA   HEENT: nl dentition, turbinates bilaterally, and oropharynx. Nl external ear canals without cough reflex   NECK :  without JVD/Nodes/TM/ nl carotid upstrokes bilaterally   LUNGS: no acc muscle use,  Nl contour chest which is clear to A and P bilaterally without cough on insp or exp maneuvers   CV:  RRR  no s3 or murmur or increase in P2, and no edema   ABD:  soft and nontender with nl inspiratory excursion in the supine position. No bruits or organomegaly appreciated, bowel sounds nl  MS:  Nl gait/ ext warm without deformities, calf tenderness, cyanosis or clubbing No obvious joint restrictions   SKIN: warm and dry without lesions    NEURO:  alert, approp, nl sensorium with  no  motor or cerebellar deficits apparent.      CXR PA and Lateral:   05/02/2018 :    I personally reviewed images and agree with radiology impression as follows:     Mild hyperinflation with hemidiaphragm flattening consistent with chronic bronchitis or reactive airway disease. No alveolar pneumonia nor CHF.    Labs ordered 05/02/2018  Allergy profile     Lab Results  Component Value Date   ESRSEDRATE 22 (H) 05/02/2018   ESRSEDRATE 1 07/03/2013    Assessment:

## 2018-05-03 LAB — RESPIRATORY ALLERGY PROFILE REGION II ~~LOC~~
ALLERGEN, CEDAR TREE, T6: 0.29 kU/L — AB
ALLERGEN, COTTONWOOD, T14: 0.91 kU/L — AB
ALLERGEN, OAK, T7: 0.35 kU/L — AB
Allergen, A. alternata, m6: <0.1 kU/L
Allergen, Comm Silver Birch, t9: 1.62 kU/L — ABNORMAL HIGH
Allergen, D pternoyssinus,d7: 0.11 kU/L — ABNORMAL HIGH
Allergen, Mouse Urine Protein, e78: <0.1 kU/L
Allergen, Mulberry, t76: <0.1 kU/L
Allergen, P. notatum, m1: 0.11 kU/L — ABNORMAL HIGH
Aspergillus fumigatus, m3: 0.76 kU/L — ABNORMAL HIGH
BERMUDA GRASS: 1.39 kU/L — AB
Box Elder IgE: 0.49 kU/L — ABNORMAL HIGH
CLADOSPORIUM HERBARUM (M2) IGE: 0.18 kU/L — AB
CLASS: 0
CLASS: 0
CLASS: 0
CLASS: 0
CLASS: 0
CLASS: 1
CLASS: 2
CLASS: 3
CLASS: 5
COCKROACH: 0.53 kU/L — AB
COMMON RAGWEED (SHORT) (W1) IGE: 1.09 kU/L — ABNORMAL HIGH
Cat Dander: 0.14 kU/L — ABNORMAL HIGH
Class: 0
Class: 0
Class: 0
Class: 0
Class: 0
Class: 1
Class: 1
Class: 1
Class: 2
Class: 2
Class: 2
Class: 2
Class: 2
Class: 2
Class: 4
D. farinae: <0.1 kU/L
DOG DANDER: 0.26 kU/L — AB
Elm IgE: 1.12 kU/L — ABNORMAL HIGH
IgE (Immunoglobulin E), Serum: 3102 kU/L — ABNORMAL HIGH (ref <=114)
Johnson Grass: 8.5 kU/L — ABNORMAL HIGH
Pecan/Hickory Tree IgE: 23.3 kU/L — ABNORMAL HIGH
ROUGH PIGWEED IGE: 0.38 kU/L — AB
Sheep Sorrel IgE: 0.77 kU/L — ABNORMAL HIGH
TIMOTHY GRASS: 53.7 kU/L — AB

## 2018-05-03 LAB — INTERPRETATION:

## 2018-05-03 NOTE — Progress Notes (Signed)
LMTCB

## 2018-05-04 NOTE — Progress Notes (Signed)
ATC multiple times- line will not ring, WCB

## 2018-05-04 NOTE — Progress Notes (Signed)
ATC multiple times- line will not ring, WCB

## 2018-05-05 ENCOUNTER — Encounter: Payer: Self-pay | Admitting: Internal Medicine

## 2018-05-05 ENCOUNTER — Telehealth: Payer: Self-pay | Admitting: Internal Medicine

## 2018-05-05 ENCOUNTER — Encounter: Payer: Self-pay | Admitting: *Deleted

## 2018-05-05 NOTE — Progress Notes (Signed)
LMTCB

## 2018-05-05 NOTE — Progress Notes (Signed)
Letter mailed

## 2018-05-05 NOTE — Assessment & Plan Note (Addendum)
Allergy profile 05/02/2018 >  Eos 0.1 /  IgE  3102  RAST pan positive> refer to [facility] - trial max gerd rx 05/02/2018     The most common causes of chronic cough in immunocompetent adults include the following: upper airway cough syndrome (UACS), previously referred to as postnasal drip syndrome (PNDS), which is caused by variety of rhinosinus conditions; (2) asthma; (3) GERD; (4) chronic bronchitis from cigarette smoking or other inhaled environmental irritants; (5) nonasthmatic eosinophilic bronchitis; and (6) bronchiectasis.   These conditions, singly or in combination, have accounted for up to 94% of the causes of chronic cough in prospective studies.   Other conditions have constituted no >6% of the causes in prospective studies These have included bronchogenic carcinoma, chronic interstitial pneumonia, sarcoidosis, left ventricular failure, ACEI-induced cough, and aspiration from a condition associated with pharyngeal dysfunction.    Chronic cough is often simultaneously caused by more than one condition. A single cause has been found from 38 to 82% of the time, multiple causes from 18 to 62%. Multiply caused cough has been the result of three diseases up to 42% of the time.        No evidence of asthma (no noct cough/ sense of pnds driving cough) but clearly quite atopic > [facility] eval best next step given language barrier (even with interpreter it was difficult to sort out c/o cough vs throat clearing which are basically same problem in literature though with quite a bit of overlap in terms of etiology and rx. Will rx max rx for gerd while awaiting allergy eval    Total time devoted to counseling  > 50 % of initial 60 min office visit:  review case with pt/ discussion of options/alternatives/ personally creating written customized instructions  in presence of pt  then going over those specific  Instructions directly with the pt including how to use all of the meds but in particular covering  each new medication in detail and the difference between the maintenance= "automatic" meds and the prns using an action plan format for the latter (If this problem/symptom => do that organization reading Left to right).  Please see AVS from this visit for a full list of these instructions which I personally wrote for this pt and  are unique to this visit.

## 2018-05-05 NOTE — Telephone Encounter (Signed)
Called number given multiple times, number was invalid. Called patients number. Unable to reach left message to give us a call back.

## 2018-05-06 ENCOUNTER — Other Ambulatory Visit: Payer: Self-pay | Admitting: Internal Medicine

## 2018-05-06 DIAGNOSIS — R05 Cough: Secondary | ICD-10-CM

## 2018-05-06 DIAGNOSIS — R053 Chronic cough: Secondary | ICD-10-CM

## 2018-05-06 NOTE — Progress Notes (Signed)
Results and recommendations for CXR and labs  given to Patient and friend Lelon MastSamantha. Referral for allergy ordered.  Patient stated understanding. Nothing further at this time.

## 2018-05-06 NOTE — Telephone Encounter (Signed)
ATC both pt and Scott English. No option to leave vm on pt's number on file.  Number provided for Scott English rang busy x2.

## 2018-05-09 NOTE — Telephone Encounter (Signed)
Number listed for Samantha rang busy x3. No answer with no option to leave vm on pt's contact number on file.

## 2018-05-10 NOTE — Telephone Encounter (Signed)
Called pt's number to see if we could get in touch with her girlfriend to relay message to her about xray results. A letter was mailed to the pt to contact our office. I will leave encounter open in case the girlfriend calls back.

## 2018-05-12 NOTE — Telephone Encounter (Signed)
We have attempted to contact the pt several times with no success or call back from the pt. A letter has been mailed to have the pt contact us back. Per triage protocol, message will be closed.

## 2018-06-17 ENCOUNTER — Encounter: Payer: Self-pay | Admitting: Internal Medicine

## 2018-06-17 ENCOUNTER — Ambulatory Visit: Payer: 59 | Admitting: Internal Medicine

## 2018-06-17 VITALS — BP 130/80 | HR 91 | Ht 63.0 in | Wt 164.0 lb

## 2018-06-17 DIAGNOSIS — R05 Cough: Secondary | ICD-10-CM

## 2018-06-17 DIAGNOSIS — R053 Chronic cough: Secondary | ICD-10-CM

## 2018-06-17 LAB — NITRIC OXIDE: Nitric Oxide: 13

## 2018-06-17 MED ORDER — PREDNISONE 10 MG PO TABS: ORAL_TABLET | ORAL | 0 refills | Status: DC

## 2018-06-17 MED ORDER — MONTELUKAST SODIUM 10 MG PO TABS: 10.0000 mg | ORAL_TABLET | Freq: Every day | ORAL | 11 refills | Status: DC

## 2018-06-17 NOTE — Assessment & Plan Note (Addendum)
Allergy profile 05/02/2018 >  Eos 0.1 /  IgE  3102  RAST pan positive> refer to Allergy - trial max gerd rx 05/02/2018 > minimal response so did not refill  - Spirometry 06/17/2018  FEV1 3.14 (97%)  Ratio 72 on no rx - FENO 06/17/2018  =  13 - Trial of singulair 10 mg daily while awaiting allergy eval      Lack of cough resolution on a verified empirical regimen could mean an alternative diagnosis (cough variant asthma or eos rhinitis/ bronchitis) , persistence of the disease state (eg sinusitis or bronchiectasis) , or inadequacy of currently available therapy (eg no medical rx available for non-acid gerd which is promoted by using mint products like gum/ reviewed with pt)    Next step in w/u is try singulair since unlikely to aggravate the cough the way empirical inhalers would and add short course of prednisone pending eval by allergy. If allergy eval not helpul, strongly rec he see Dr Jayme CloudGonzalez in [place] who is really good with spanish speaking pts with atypical airway syndromes.   I had an extended discussion with the patient reviewing all relevant studies completed to date and  lasting 15 to 20 minutes of a 25 minute visit    Each maintenance medication was reviewed in detail including most importantly the difference between maintenance and prns and under what circumstances the prns are to be triggered using an action plan format that is not reflected in the computer generated alphabetically organized AVS.    Please see AVS for specific instructions unique to this visit that I personally wrote and verbalized to the the pt in detail and then reviewed with pt  by my nurse highlighting any  changes in therapy recommended at today's visit to their plan of care.

## 2018-06-17 NOTE — Progress Notes (Signed)
Subjective:     Patient ID: Scott English, male   DOB: 1971/02/12,   MRN: [id number]    Brief patient profile:  47 yo latino male never smoker with chronic cough since arrival in [place] 2005 mostly assoc with throat clearing referred to pulmonary clinic 05/02/2018 by Dr   Via  With ? Cough variant asthma.    History of Present Illness  05/02/2018 1st [facility] office visit/ Scott English   Chief Complaint  Patient presents with  . Pulmonary Consult    Referred by Dr. [name] Via for eval of abnormal cxr. Pt states he was hit by a fork lift at work 2 wks ago and this is why cxr was done.  He states he has had throat clearing problem for years, and since his accident his cough seems worse- prod with clear sputum.   Cough is day > noct/ really min vol x years no better on gerd rx so far but not consistent with rx ac Doe also x years x more than slow walk rec Pantoprazole (protonix) 40 mg   Take  30-60 min before first meal of the day and Pepcid (famotidine)  20 mg one @  bedtime until return to office - this is the best way to tell whether stomach acid is contributing to your problem.   GERD diet  Please remember to go to the lab and x-ray department downstairs in the basement  for your tests - we will call you with the results when they are available.    06/17/2018  f/u ov/Scott English re: cough not much better on gerd rx  Chief Complaint  Patient presents with  . Follow-up    Cough is unchanged. No new co's.     Cough: does not wake up but by the time he starts working the cough is worse, better on weekend > min clear mucus     SABA use:  No inhalers  Has never tried singulair Did take prednisone 2015 for contact dermatitis but does not recall effect on cough    No obvious day to day or daytime variability or assoc excess/ purulent sputum or mucus plugs or hemoptysis or cp or chest tightness, subjective wheeze or overt sinus or hb symptoms.   Sleeping fine on 2 pillows  without nocturnal  or  early am exacerbation  of respiratory  c/o's or need for noct saba. Also denies any obvious fluctuation of symptoms with weather or environmental changes or other aggravating or alleviating factors except as outlined above   No unusual exposure hx or h/o childhood pna/ asthma or knowledge of premature birth.  Current Allergies, Complete Past Medical History, Past Surgical History, Family History, and Social History were reviewed in [facility] record.  ROS  The following are not active complaints unless bolded Hoarseness, sore throat, dysphagia, dental problems, itching, sneezing,  nasal congestion or discharge of excess mucus or purulent secretions, ear ache,   fever, chills, sweats, unintended wt loss or wt gain, classically pleuritic or exertional cp,  orthopnea pnd or arm/hand swelling  or leg swelling, presyncope, palpitations, abdominal pain, anorexia, nausea, vomiting, diarrhea  or change in bowel habits or change in bladder habits, change in stools or change in urine, dysuria, hematuria,  rash, arthralgias, visual complaints, headache, numbness, weakness or ataxia or problems with walking or coordination,  change in mood or  memory.         Meds:  None    .  Objective:   Physical Exam  amb latino male chewing mint gum   06/17/2018        164   05/02/18 165 lb (74.8 kg)  05/16/14 164 lb (74.4 kg)  04/13/14 157 lb (71.2 kg)     Vital signs reviewed - Note on arrival 02 sats  100% on RA      HEENT: nl dentition,   and oropharynx with min cobblestoning . Nl external ear canals without cough reflex - mild bilateral non-specific turbinate edema     NECK :  without JVD/Nodes/TM/ nl carotid upstrokes bilaterally   LUNGS: no acc muscle use,  Nl contour chest which is clear to A and P bilaterally without cough on insp or exp maneuvers   CV:  RRR  no s3 or murmur or increase in P2, and no edema   ABD:  soft and nontender with nl inspiratory excursion  in the supine position. No bruits or organomegaly appreciated, bowel sounds nl  MS:  Nl gait/ ext warm without deformities, calf tenderness, cyanosis or clubbing No obvious joint restrictions   SKIN: warm and dry without lesions    NEURO:  alert, approp, nl sensorium with  no motor or cerebellar deficits apparent.         CXR PA and Lateral:   05/02/2018 :    I personally reviewed images and agree with radiology impression as follows:     Mild hyperinflation with hemidiaphragm flattening consistent with chronic bronchitis or reactive airway disease. No alveolar pneumonia nor CHF.        Assessment:

## 2018-06-17 NOTE — Patient Instructions (Addendum)
Singulair 10 mg one daily in evening  If cough or breathing getting worse on singulair:  Add Prednisone 10 mg take  4 each am x 2 days,   2 each am x 2 days,  1 each am x 2 days and stop

## 2018-07-15 ENCOUNTER — Ambulatory Visit: Payer: 59 | Admitting: Allergy

## 2019-03-01 IMAGING — DX DG CHEST 2V
2 series · 2 of 2 positions shown · non-contrast
Comparison: Chest x-ray of November 27, 2013

CLINICAL DATA: Many years of chronic cough. Four days of chest
tightness. Patient also reports dyspnea on exertion. Nonsmoker.

EXAM:
CHEST - 2 VIEW

[chest pa]
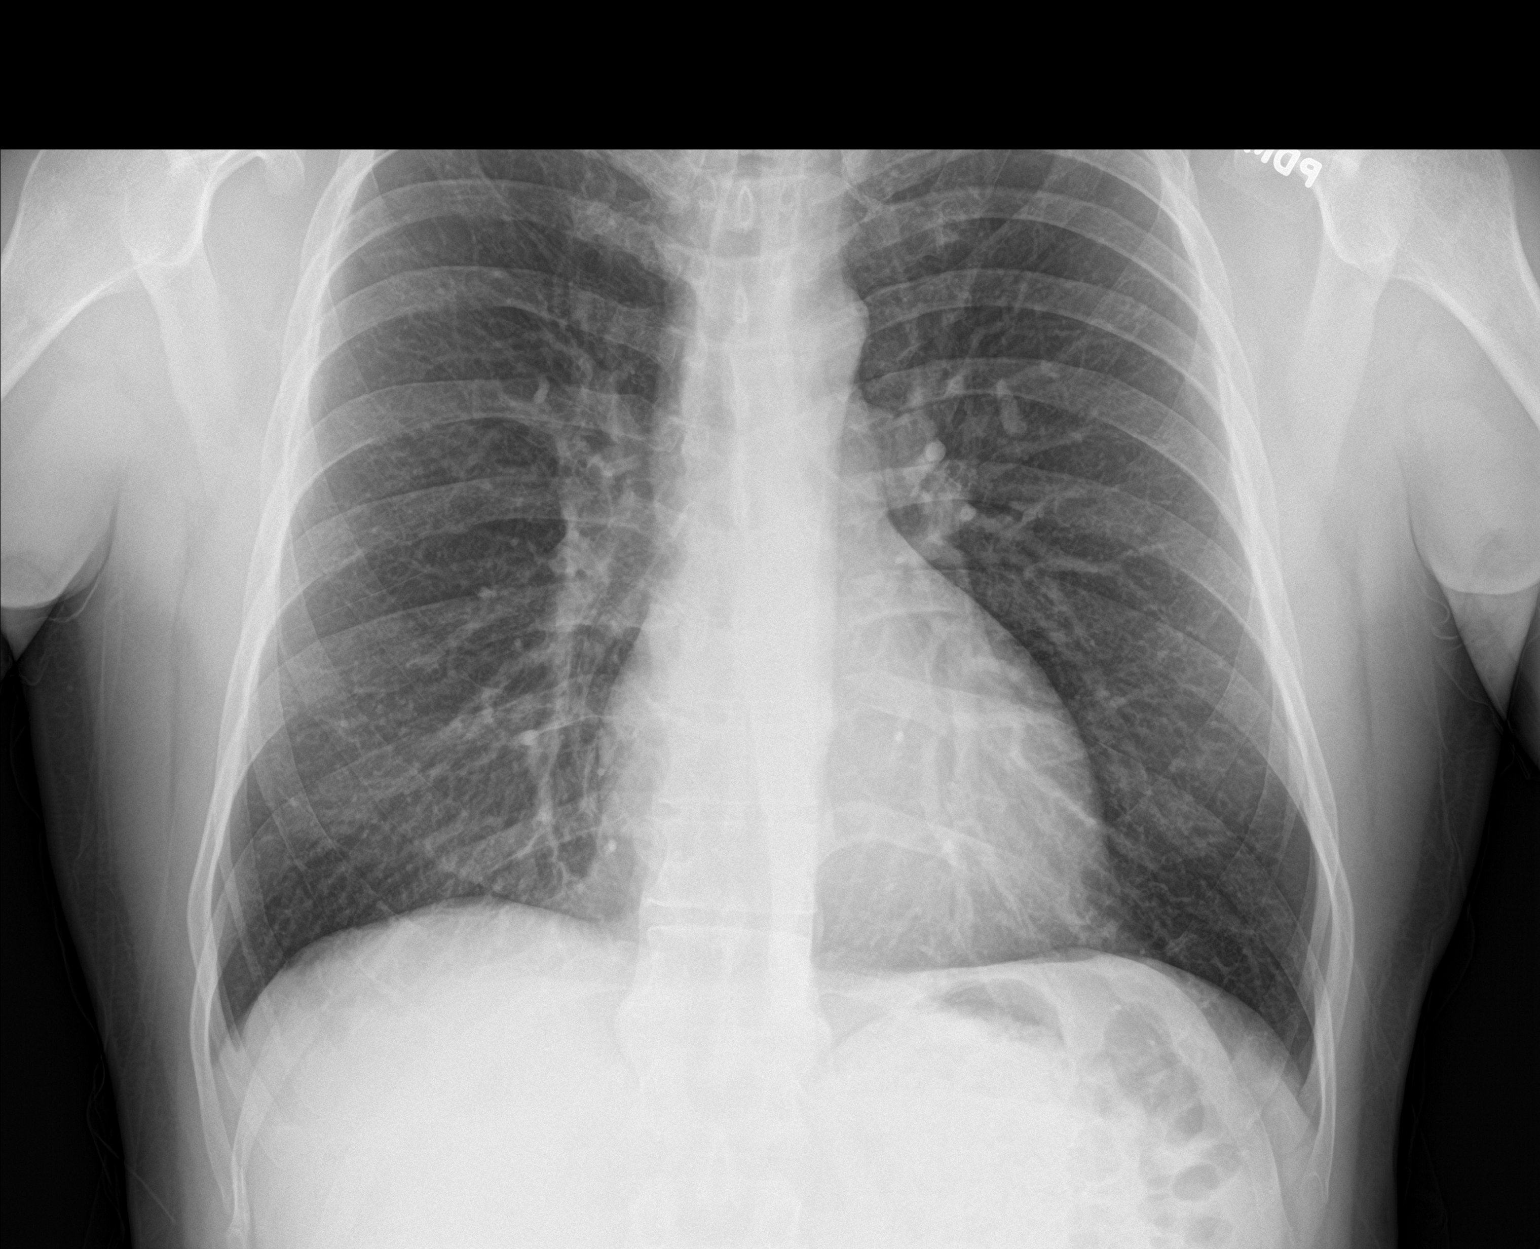

[chest lat]
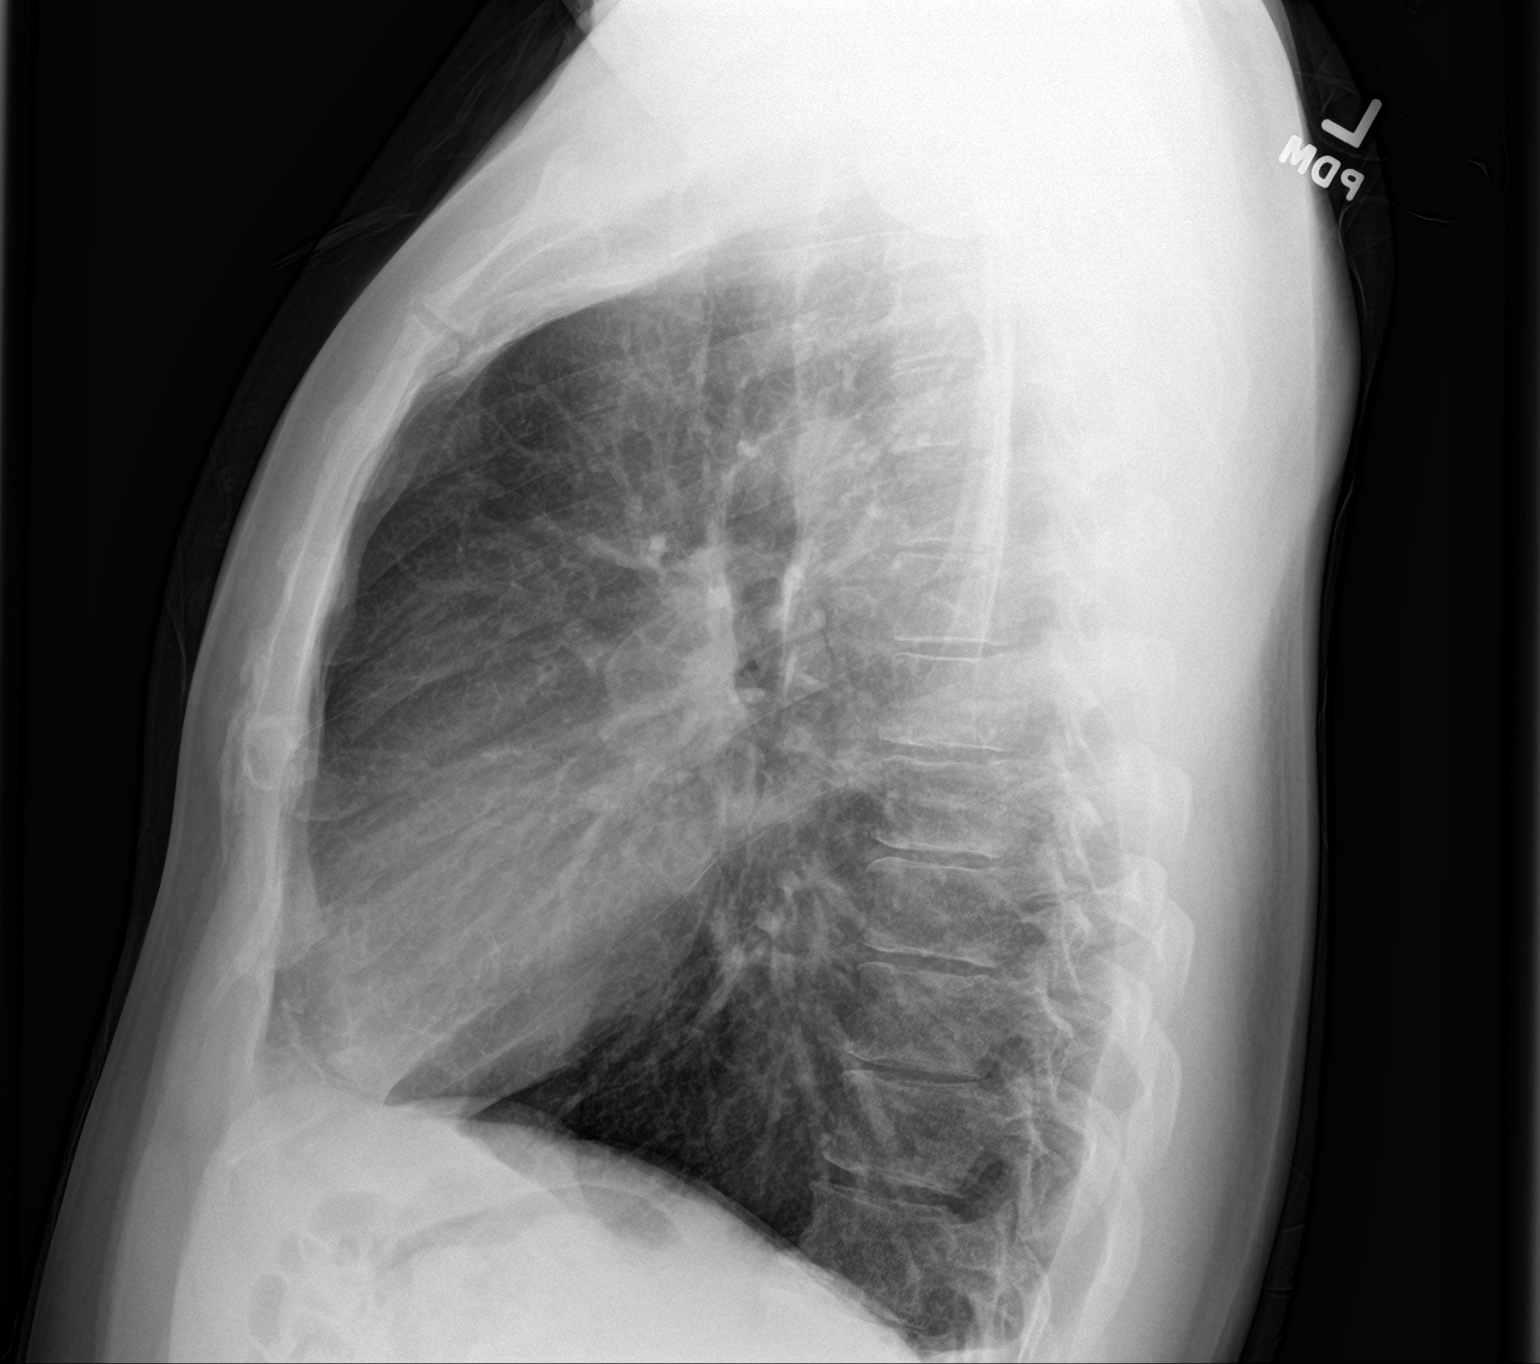

[2 of 2 positions shown; findings below may reference images not displayed]

FINDINGS: The lungs are well-expanded. There is no focal infiltrate. There is
mild hemidiaphragm flattening. The heart and pulmonary vascularity
are normal. The mediastinum is normal in width. There is no pleural
effusion or pneumothorax. The bony thorax is unremarkable.
IMPRESSION: Mild hyperinflation with hemidiaphragm flattening consistent with
chronic bronchitis or reactive airway disease. No alveolar pneumonia
nor CHF.

## 2021-02-04 ENCOUNTER — Other Ambulatory Visit: Payer: Self-pay

## 2021-02-04 ENCOUNTER — Encounter (HOSPITAL_COMMUNITY): Payer: Self-pay

## 2021-02-04 ENCOUNTER — Emergency Department (HOSPITAL_COMMUNITY)
Admission: EM | Admit: 2021-02-04 | Discharge: 2021-02-04 | Disposition: A | Discharge status: Home / Self Care | Payer: Worker's Compensation | Attending: Emergency Medicine | Admitting: Emergency Medicine

## 2021-02-04 DIAGNOSIS — X500XXA Overexertion from strenuous movement or load, initial encounter: Secondary | ICD-10-CM | POA: Insufficient documentation

## 2021-02-04 DIAGNOSIS — Y990 Civilian activity done for income or pay: Secondary | ICD-10-CM | POA: Diagnosis not present

## 2021-02-04 DIAGNOSIS — N50811 Right testicular pain: Secondary | ICD-10-CM

## 2021-02-04 DIAGNOSIS — M545 Low back pain, unspecified: Secondary | ICD-10-CM | POA: Insufficient documentation

## 2021-02-04 MED ORDER — METHOCARBAMOL 500 MG PO TABS: 500.0000 mg | ORAL_TABLET | Freq: Two times a day (BID) | ORAL | 0 refills | Status: DC

## 2021-02-04 MED ORDER — KETOROLAC TROMETHAMINE 60 MG/2ML IM SOLN
60.0000 mg | Freq: Once | INTRAMUSCULAR | Status: AC
  Administered 2021-02-04: 60 mg via INTRAMUSCULAR
  Filled 2021-02-04: qty 2

## 2021-02-04 MED ORDER — METHOCARBAMOL 500 MG PO TABS
500.0000 mg | ORAL_TABLET | Freq: Once | ORAL | Status: AC
  Administered 2021-02-04: 500 mg via ORAL
  Filled 2021-02-04: qty 1

## 2021-02-04 NOTE — ED Provider Notes (Signed)
[facility]-EMERGENCY DEPT Provider Note   CSN: [id number] Arrival date & time: 02/04/21  1934     History Chief Complaint  Patient presents with  . Back Pain    Scott English is a 50 y.o. male.  HPI 50 year old male with a history of chronic cough presents to the ER with complaints of back pain since Thursday.  History provided by [place] interpreter.  Patient states that he was lifting some heavy things at work and began to develop left-sided back pain.  Pain is exacerbated with movement, relieved with rest.  He denies any loss of bowel bladder control, fevers, chills, unintended weight loss, IV drug use, foot drop.  He states initially for the first few days he was having some pain with walking but this has improved.  He also complains of pain going into his right testicle only when he sits up or flexes his abdomen.  He denies any scrotal swelling, pain, fevers, chills, dysuria, hematuria.  Been taking Tylenol for pain with little relief.  History of falls or traumatic injuries.  He has no chest pain, shortness of breath, dizziness, syncope, flank pain.    History reviewed. No pertinent past medical history.  Patient Active Problem List   Diagnosis Date Noted  . Chronic cough 05/02/2018  . DOE (dyspnea on exertion) 05/02/2018    History reviewed. No pertinent surgical history.     No family history on file.  Social History   Tobacco Use  . Smoking status: Never Smoker  . Smokeless tobacco: Never Used  Substance Use Topics  . Alcohol use: Yes    Alcohol/week: 0.0 standard drinks    Comment: occasional  . Drug use: No    Home Medications Prior to Admission medications   Medication Sig Start Date End Date Taking? Authorizing Provider  montelukast (SINGULAIR) 10 MG tablet Take 1 tablet (10 mg total) by mouth at bedtime. 06/17/18   Nyoka Cowden, MD  predniSONE (DELTASONE) 10 MG tablet Take  4 each am x 2 days,   2 each am x 2 days,  1 each am x 2  days and stop 06/17/18   Nyoka Cowden, MD    Allergies    Patient has no known allergies.  Review of Systems   Review of Systems  Constitutional: Negative for chills and fever.  Genitourinary: Positive for testicular pain. Negative for decreased urine volume, dysuria, flank pain, frequency, penile discharge, penile pain, penile swelling and scrotal swelling.  Musculoskeletal: Positive for back pain. Negative for gait problem, neck pain and neck stiffness.  Neurological: Negative for weakness and numbness.    Physical Exam Updated Vital Signs BP (!) 141/82 (BP Location: Left Arm)   Pulse (!) 58   Temp 98.1 F (36.7 C) (Oral)   Resp 18   SpO2 100%   Physical Exam Vitals and nursing note reviewed.  Constitutional:      Appearance: He is well-developed and well-nourished.  HENT:     Head: Normocephalic and atraumatic.  Eyes:     Conjunctiva/sclera: Conjunctivae normal.  Cardiovascular:     Rate and Rhythm: Normal rate and regular rhythm.     Heart sounds: No murmur heard.   Pulmonary:     Effort: Pulmonary effort is normal. No respiratory distress.     Breath sounds: Normal breath sounds.  Abdominal:     Palpations: Abdomen is soft.     Tenderness: There is no abdominal tenderness.  Genitourinary:    Penis: Normal.  Testes: Normal.     Comments: Uncircumcised penis.  Scrotal exam performed with EMT in the room.  No palpable hernia, no scrotal swelling, warmth, erythema, no reproducible testicular tenderness bilaterally, no epididymal tenderness.  No visible penile discharge, erythema around the urethra.  Cremasteric reflex intact Musculoskeletal:        General: No edema.     Cervical back: Neck supple.     Comments: No C, T, L-spine tenderness.  5/5 strength in upper and lower extremities.  No noticeable step-offs, crepitus, fluctuance, erythema.  Sensations intact.  Full range of motion and strength of neck. Moving all 4 extremities without difficulty.  He does  have some associated paraspinal muscle tenderness to the left side of the L-spine, however no flank tenderness.  He also has some SI joint tenderness on the left    Skin:    General: Skin is warm and dry.  Neurological:     Mental Status: He is alert.  Psychiatric:        Mood and Affect: Mood and affect normal.     ED Results / Procedures / Treatments   Labs (all labs ordered are listed, but only abnormal results are displayed) Labs Reviewed - No data to display  EKG None  Radiology No results found.  Procedures Procedures   Medications Ordered in ED Medications  ketorolac (TORADOL) injection 60 mg (has no administration in time range)  methocarbamol (ROBAXIN) tablet 500 mg (has no administration in time range)    ED Course  I have reviewed the triage vital signs and the nursing notes.  Pertinent labs & imaging results that were available during my care of the patient were reviewed by me and considered in my medical decision making (see chart for details).    MDM Rules/Calculators/A&P                         50 year old male who presents to the ER with left-sided back pain and right-sided scrotal pain.  Normal neurological exam, no evidence of urinary incontinence or retention, pain is consistently reproducible.  He has associated L-spine paraspinal muscle tenderness and tenderness to the SI joint.  There is no evidence of AAA or concern for dissection at this time.   Patient can walk but states is mildly painful. No loss of bowel or bladder control.  No concern for cauda equina.  No fever, night sweats, weight loss, h/o cancer, IVDU.    In terms of his scrotal pain, I cannot palpate a inguinal hernia on exam.  He has no evidence of epididymitis, testicular torsion, STD infection or any other emergent cause of testicular pain.  He only experiences pain when sitting up and flexing.  Could be possible early signs of a hernia but no evidence of this currently.  I  discussed the reassuring findings with the patient and his wife at bedside.  Low suspicion for cauda equina, renal stones at this time.  Encouraged continuing ibuprofen, will prescribe Robaxin (patient was educated on sedating side effects and instructed to not drink or drive the medication and to take it at night).  We discussed very strict return precautions, which included any foot drop, loss of bowel or bladder control, numbness or tingling to his lower extremities.  Also encouraged gentle low back stretches, heating pad.  I stressed PCP follow-up if his symptoms continue   Pain treated here in the department with adequate improvement. RICE protocol and pain medicine indicated and discussed  with patient.    Case discussed with Dr. Criss Alvine who is agreeable to the above plan and disposition.   At this time, I do not feel there is any life-threatening condition present. I have reviewed nursing notes and appropriate previous records.  I feel the patient is safe to be discharged home without further emergent workup. Discussed usual and customary return precautions. Patient and family (if present) verbalize understanding and are comfortable with this plan.  Patient will follow-up with their primary care provider. If they do not have a primary care provider, information for follow-up has been provided to them.   All questions have been answered. Portions of this chart were dictated utilizing voice recognition software.  Despite best efforts to proofread,  errors can occur which can change the meaning of documentation.     Final Clinical Impression(s) / ED Diagnoses Final diagnoses:  Acute left-sided low back pain without sciatica  Pain in right testicle    Rx / DC Orders ED Discharge Orders    None       Leone Brand 02/04/21 2029    Pricilla Loveless, MD 02/06/21 [phone]

## 2021-02-04 NOTE — ED Triage Notes (Signed)
Pt reports lower back pain that radiates to testicles since Thursday. Denies dysuria, denies flank pain or hematuria.

## 2021-02-04 NOTE — Discharge Instructions (Addendum)
Dolor de espalda:  El dolor de espalda es muy comn. El dolor a menudo mejora con Museum/gallery conservator. La causa del dolor de espalda no suele ser peligrosa. La [employer] de las personas pueden aprender a Company secretary su dolor de espalda por su cuenta.  Sin embargo, si desarrolla un dolor intenso o que empeora, dolor lumbar con fiebre, entumecimiento, debilidad o incapacidad para caminar u orinar/defecar, debe regresar a la sala de emergencias de inmediato. Haga un seguimiento con su mdico esta semana para una nueva revisin si an tiene sntomas.  El dolor lumbar es una molestia en la zona lumbar que puede deberse a lesiones en los msculos y ligamentos que rodean la columna vertebral. Ocasionalmente, puede ser causado por un problema en una parte de la columna llamada disco. El dolor puede durar varios das o [place]; Sin embargo, la Heritage manager de los pacientes se recuperan completamente en 4 semanas.  Los medicamentos tambin son tiles para ayudar a Human resources officer. Un medicamento comnmente recetado incluye paracetamol. Este medicamento generalmente es seguro, aunque no debe tomar ms de 8 pldoras extra fuertes (500 mg) al [employer].  Medicamentos antiinflamatorios no esteroideos, incluidos ibuprofeno y naproxeno; puede tomar hasta 800 mg de ibuprofeno hasta 3 veces al [employer]. Estos medicamentos ayudan tanto al dolor como a la hinchazn y son muy tiles para tratar Chief Technology Officer de espalda. Deben tomarse con alimentos, ya que pueden causar [employer] y, lo que es ms grave, sangrado estomacal.  Tenga en cuenta que si desarrolla nuevos sntomas, como fiebre, debilidad en las piernas, dificultad o prdida del control de la orina o los intestinos, dolor abdominal o un dolor ms intenso, deber buscar atencin mdica y/o regresar al Astronomer de Associate Professor.   Cuidados en el hogar ? Mantenerse activo. Comience con caminatas cortas en terreno llano si puede. Trate de caminar ms cada da. ? No se siente, conduzca ni  permanezca de pie en un mismo lugar durante ms de 30 minutos. No te quedes en la cama. ? No evites el ejercicio o el trabajo. La actividad puede ayudar a que su espalda sane ms rpido. ? Tenga cuidado cuando doble o levante un objeto. Doble las rodillas, mantenga el objeto cerca de usted y no lo [place]. ? Duerma en un colchn firme. Acustese de lado y doble las rodillas. Si se acuesta boca arriba, coloque una almohada debajo de las rodillas. ? Solo tome los [employer] indique su mdico. ? Ponga hielo en el rea lesionada. ? Pon hielo en una bolsa de plstico. ? Coloque una toalla entre su piel y la [address]. ? Deje el hielo durante 15-20 minutos, 3-4 veces al [employer] primeros 2-3 [place]. 210 Despus de eso, puede cambiar entre compresas de hielo y Airline pilot. ? Pregntele a su mdico sobre ejercicios o masajes para la espalda. ? Evite sentirse ansioso o estresado. Encuentre buenas formas de lidiar con el estrs, como el ejercicio.  Obtenga ayuda de la manera correcta si: ? Su dolor no desaparece con descanso o medicamentos. ? Su dolor no desaparece en 1 semana. ? Tienes nuevos problemas. ? Usted no se siente bien. ? El dolor se extiende a tus piernas. ? No puede controlar cundo hace pop (evacuacin intestinal) u orina [place]) ? Se siente mal del estmago (nuseas) o vomita (vmitos) ? Tiene dolor de barriga (abdominal). ? Siente que puede desmayarse [place]). ? Si desarrolla fiebre.  Asegrese: - Comprenda estas instrucciones. -Cuida tu condicin -Obtenga ayuda de inmediato si no est bien  o empeora.   Dolor testicular: Si sus sntomas no mejoran, haga un seguimiento con su mdico de atencin primaria. Puede beneficiarse de algo de fisioterapia. No estoy exactamente seguro de cul es la causa de su dolor testicular, esto podra ser una seal temprana de desarrollo de una hernia. Sin embargo, no sospecho que haya infecciones, torsin testicular o cualquier otra causa de  dolor testicular que ponga en peligro la vida. Asegrese de regresar a la sala de emergencias si sus sntomas empeoran.     Back Pain:  Back pain is very common.  The pain often gets better over time.  The cause of back pain is usually not dangerous.  Most people can learn to manage their back pain on their own.  However if you develop severe or worsening pain, low back pain with fever, numbness, weakness or inability to walk or urinate/stool, you should return to the ER immediately.  Please follow up with your doctor this week for a recheck if still having symptoms.  Low back pain is discomfort in the lower back that may be due to injuries to muscles and ligaments around the spine.  Occasionally, it may be caused by a a problem to a part of the spine called a disc. The pain may last several days or a week;  However, most patients get completely well in 4 weeks.  Medications are also useful to help with pain control.  A commonly prescribed medications includes acetaminophen.  This medication is generally safe, though you should not take more than 8 of the extra strength (500mg ) pills a day.  Non steroidal anti inflammatory medications including Ibuprofen and naproxen; you may take up to 800 mg of ibuprofen up to 3 times daily.  These medications help both pain and swelling and are very useful in treating back pain.  They should be taken with food, as they can cause stomach upset, and more seriously, stomach bleeding.    Be aware that if you develop new symptoms, such as a fever, leg weakness, difficulty with or loss of control of your urine or bowels, abdominal pain, or more severe pain, you will need to seek medical attention and  / or return to the Emergency department.    Home Care Stay active.  Start with short walks on flat ground if you can.  Try to walk farther each day. Do not sit, drive or stand in one place for more than 30 minutes.  Do not stay in bed. Do not avoid exercise or work.   Activity can help your back heal faster. Be careful when you bend or lift an object.  Bend at your knees, keep the object close to you, and do not twist. Sleep on a firm mattress.  Lie on your side, and bend your knees.  If you lie on your back, put a pillow under your knees. Only take medicines as told by your doctor. Put ice on the injured area. Put ice in a plastic bag Place a towel between your skin and the bag Leave the ice on for 15-20 minutes, 3-4 times a day for the first 2-3 days. 210 After that, you can switch between ice and heat packs. Ask your doctor about back exercises or massage. Avoid feeling anxious or stressed.  Find good ways to deal with stress, such as exercise.  Get Help Right Way If: Your pain does not go away with rest or medicine. Your pain does not go away in 1 week. You have new  problems. You do not feel well. The pain spreads into your legs. You cannot control when you poop (bowel movement) or pee (urinate) You feel sick to your stomach (nauseous) or throw up (vomit) You have belly (abdominal) pain. You feel like you may pass out (faint). If you develop a fever.  Make Sure you: Understand these instructions. Watch your condition Get help right away if you are not doing well or get worse.   Testicular pain:  If your symptoms do not improve, please follow-up with your primary care doctor.  He may benefit from some physical therapy.  I am not exactly sure what the cause of your testicular pain is, this could be possibly early sign of developing hernia.  However I do not suspect any infections, testicular torsion, or any other life-threatening cause of testicular pain.  Please make sure to return to the ER if your symptoms worsen.

## 2021-02-04 NOTE — ED Notes (Signed)
An After Visit Summary was printed and given to the patient. Discharge instructions given and no further questions at this time.  Pt leaving with girlfriend.

## 2024-03-15 ENCOUNTER — Ambulatory Visit (HOSPITAL_COMMUNITY)
Admission: EM | Admit: 2024-03-15 | Discharge: 2024-03-15 | Disposition: A | Discharge status: Home / Self Care | Payer: Self-pay | Attending: Family Medicine | Admitting: Family Medicine

## 2024-03-15 DIAGNOSIS — S61511A Laceration without foreign body of right wrist, initial encounter: Secondary | ICD-10-CM

## 2024-03-15 NOTE — ED Triage Notes (Signed)
 Patient presents to office for right wrist laceration. Patient cut his wrist on a piece of metal.  Last known TDAP is UTD.

## 2024-03-18 NOTE — ED Provider Notes (Signed)
  [facility]   [id number] 03/15/24 Arrival Time: 1936  ASSESSMENT & PLAN:  1. Wrist laceration, right, initial encounter    Wound cleaned copiously. Discussed sutures vs steri-strips. He prefers steri-strips but understand need to care for wound carefully.  3 steri strips placed to approximate edges of R wrist wound. No active bleeding. All fingers with normal ROM and cap refill.    Reviewed expectations re: course of current medical issues. Questions answered. Outlined signs and symptoms indicating need for more acute intervention. Patient verbalized understanding. After Visit Summary given.   SUBJECTIVE:  Scott English is a 53 y.o. male who presents with a laceration of R wrist. Patient presents to office for right wrist laceration. Patient cut his wrist on a piece of metal.  Last known TDAP is UTD.    ROS: As per HPI. Health Maintenance Due  Topic Date Due   HIV Screening  Never done   Hepatitis C Screening  Never done   Colonoscopy  Never done   DTaP/Tdap/Td (2 - Td or Tdap) 09/24/2022   COVID-19 Vaccine (1 - 2024-25 season) Never done   Zoster Vaccines- Shingrix (1 of 2) Never done    OBJECTIVE:  Vitals:   03/15/24 2004  BP: 113/69  Pulse: 70  Resp: 16  Temp: 98 F (36.7 C)  TempSrc: Oral  SpO2: 97%    General appearance: alert; no distress Skin: fairly superficial and slightly curved laceration of lateral edge of R wrist; size: approx 1.5 cm; clean wound edges, no foreign bodies; without active bleeding; wrist with FROm Psychological: alert and cooperative; normal mood and affect    Labs Reviewed - No data to display  No results found.  No Known Allergies  No past medical history on file. Social History   Socioeconomic History   Marital status: Married    Spouse name: Not on file   Number of children: Not on file   Years of education: Not on file   Highest education level: Not on file  Occupational History   Not on file   Tobacco Use   Smoking status: Never   Smokeless tobacco: Never  Substance and Sexual Activity   Alcohol use: Yes    Alcohol/week: 0.0 standard drinks of alcohol    Comment: occasional   Drug use: No   Sexual activity: Not on file  Other Topics Concern   Not on file  Social History Narrative   Not on file   Social Drivers of Health   Financial Resource Strain: Not on file  Food Insecurity: Not on file  Transportation Needs: Not on file  Physical Activity: Not on file  Stress: Not on file  Social Connections: Unknown (04/10/2022)   Received from [facility]   Social Network    Social Network: Not on file          [place], Polly Brink, MD 03/18/24 [phone]
